# Patient Record
Sex: Female | Born: 1945 | Race: White | Hispanic: No | Smoking: Current every day smoker
Health system: Southern US, Community
[De-identification: ages and names within clinical notes are randomized; demographics above are authoritative.]

## PROBLEM LIST (undated history)

## (undated) DIAGNOSIS — J449 Chronic obstructive pulmonary disease, unspecified: Secondary | ICD-10-CM

## (undated) DIAGNOSIS — E7800 Pure hypercholesterolemia, unspecified: Secondary | ICD-10-CM

## (undated) DIAGNOSIS — J45909 Unspecified asthma, uncomplicated: Secondary | ICD-10-CM

## (undated) DIAGNOSIS — N200 Calculus of kidney: Secondary | ICD-10-CM

## (undated) DIAGNOSIS — C801 Malignant (primary) neoplasm, unspecified: Secondary | ICD-10-CM

## (undated) DIAGNOSIS — I10 Essential (primary) hypertension: Secondary | ICD-10-CM

## (undated) DIAGNOSIS — R0602 Shortness of breath: Secondary | ICD-10-CM

## (undated) HISTORY — PX: EXPLORATORY LAPAROTOMY: SUR591

## (undated) HISTORY — PX: ABDOMINAL HYSTERECTOMY: SHX81

## (undated) HISTORY — PX: CHOLECYSTECTOMY: SHX55

---

## 1978-09-05 HISTORY — PX: OTHER SURGICAL HISTORY: SHX169

## 2001-03-15 ENCOUNTER — Emergency Department (HOSPITAL_COMMUNITY): Admission: EM | Admit: 2001-03-15 | Discharge: 2001-03-15 | Payer: Self-pay | Admitting: *Deleted

## 2001-03-15 ENCOUNTER — Encounter: Payer: Self-pay | Admitting: *Deleted

## 2003-06-23 ENCOUNTER — Encounter: Payer: Self-pay | Admitting: Family Medicine

## 2003-06-23 ENCOUNTER — Ambulatory Visit (HOSPITAL_COMMUNITY): Admission: RE | Admit: 2003-06-23 | Discharge: 2003-06-23 | Payer: Self-pay | Admitting: Family Medicine

## 2004-09-01 ENCOUNTER — Ambulatory Visit (HOSPITAL_COMMUNITY): Admission: RE | Admit: 2004-09-01 | Discharge: 2004-09-01 | Payer: Self-pay | Admitting: Family Medicine

## 2006-04-17 ENCOUNTER — Ambulatory Visit (HOSPITAL_COMMUNITY): Admission: RE | Admit: 2006-04-17 | Discharge: 2006-04-17 | Payer: Self-pay | Admitting: Family Medicine

## 2007-03-28 ENCOUNTER — Ambulatory Visit (HOSPITAL_COMMUNITY): Admission: RE | Admit: 2007-03-28 | Discharge: 2007-03-28 | Payer: Self-pay | Admitting: Family Medicine

## 2007-04-25 ENCOUNTER — Ambulatory Visit: Payer: Self-pay | Admitting: Vascular Surgery

## 2007-05-31 ENCOUNTER — Ambulatory Visit: Payer: Self-pay | Admitting: Vascular Surgery

## 2008-08-18 ENCOUNTER — Emergency Department (HOSPITAL_COMMUNITY): Admission: EM | Admit: 2008-08-18 | Discharge: 2008-08-18 | Payer: Self-pay | Admitting: Emergency Medicine

## 2010-04-13 ENCOUNTER — Ambulatory Visit (HOSPITAL_COMMUNITY)
Admission: RE | Admit: 2010-04-13 | Discharge: 2010-04-13 | Payer: Self-pay | Source: Home / Self Care | Admitting: Family Medicine

## 2010-09-30 ENCOUNTER — Ambulatory Visit (HOSPITAL_COMMUNITY)
Admission: RE | Admit: 2010-09-30 | Discharge: 2010-09-30 | Payer: Self-pay | Source: Home / Self Care | Attending: Family Medicine | Admitting: Family Medicine

## 2010-10-04 ENCOUNTER — Ambulatory Visit (HOSPITAL_COMMUNITY)
Admission: RE | Admit: 2010-10-04 | Discharge: 2010-10-04 | Payer: Self-pay | Source: Home / Self Care | Attending: Family Medicine | Admitting: Family Medicine

## 2010-10-05 ENCOUNTER — Ambulatory Visit (HOSPITAL_COMMUNITY)
Admission: RE | Admit: 2010-10-05 | Discharge: 2010-10-05 | Payer: Self-pay | Source: Home / Self Care | Attending: Family Medicine | Admitting: Family Medicine

## 2011-01-18 NOTE — Consult Note (Signed)
NEW PATIENT CONSULTATION   Sarah [name]  DOB:  1946-05-16                                       04/25/2007  [id number]#:[id number]   Patient presents today for evaluation of venous pathology.  She is a  very pleasant 65 year old white female with pain over her right  vertebral area.  I had treated patient's husband for abdominal aortic  aneurysm years past.  He is subsequently deceased, and we discussed this  as well.  She reports a throbbing pain over the pretibial area of  reticular veins over her right shin.  She has marked telangiectasia over  both legs, extending also on her chest and also on her face,  particularly in her cheeks bilaterally.  She does report that she has  had some relief with over-the-counter men's support stockings in the  past.  She reports that she has a throbbing pain over these areas on the  pretibial on the right and also on her right lateral calf.   Her past history is significant for hypertension, chronic bronchitis.  She does smoke 1 to 1-1/2 packs of cigarettes per day, has several  social alcohol drinks per week.   Review of systems is positive for heart murmur, bronchitis, asthma,  hiatal hernia.   Medications are atenolol, aspirin, B12, and vitamins.   PHYSICAL EXAMINATION:  A well-developed and well-nourished white female  appearing her staged age of 89.  Blood pressure is 141/83, pulse 68,  respirations 18.  Her dorsalis pedis pulses are 2+ bilaterally.  She  does not have any marked saphenous vein varicosity or any evidence of  tributary varicosities.  She does have reticular veins over the  pretibial area with tenderness also over the lateral right calf.   By hand-held duplex, she does not have any evidence of saphenous vein  reflux.  I discussed this with patient.  I explained that we would  recommend sclerotherapy to these reticular veins for relief of her  symptoms.  She understands this would not be covered by  insurance.  I  explained the cost for this treatment to her, and she is comfortable  with this.  I also explained that we would attempt treatment of her face  and chest following this for cosmetic concerns.  We will schedule this  at her convenience electively.   Larina Earthly, M.D.  Electronically Signed   TFE/MEDQ  D:  04/25/2007  T:  04/27/2007  Job:  [id number]   cc:   Mila Homer. Sudie Bailey, M.D.

## 2011-06-09 LAB — DIFFERENTIAL
Basophils Absolute: 0 10*3/uL (ref 0.0–0.1)
Basophils Relative: 0 % (ref 0–1)
Eosinophils Absolute: 0 10*3/uL (ref 0.0–0.7)
Eosinophils Relative: 0 % (ref 0–5)
Lymphocytes Relative: 4 % — ABNORMAL LOW (ref 12–46)
Lymphs Abs: 0.6 10*3/uL — ABNORMAL LOW (ref 0.7–4.0)
Monocytes Absolute: 0.7 10*3/uL (ref 0.1–1.0)
Monocytes Relative: 5 % (ref 3–12)
Neutro Abs: 12.9 10*3/uL — ABNORMAL HIGH (ref 1.7–7.7)
Neutrophils Relative %: 91 % — ABNORMAL HIGH (ref 43–77)

## 2011-06-09 LAB — CBC
HCT: 38.5 % (ref 36.0–46.0)
Hemoglobin: 13.1 g/dL (ref 12.0–15.0)
MCHC: 34.1 g/dL (ref 30.0–36.0)
MCV: 90.5 fL (ref 78.0–100.0)
Platelets: 207 10*3/uL (ref 150–400)
RBC: 4.25 MIL/uL (ref 3.87–5.11)
RDW: 12.7 % (ref 11.5–15.5)
WBC: 14.2 10*3/uL — ABNORMAL HIGH (ref 4.0–10.5)

## 2011-06-09 LAB — CULTURE, BLOOD (ROUTINE X 2)
Culture: NO GROWTH
Report Status: 12192009

## 2011-06-09 LAB — BASIC METABOLIC PANEL
BUN: 14 mg/dL (ref 6–23)
Chloride: 104 mEq/L (ref 96–112)
Creatinine, Ser: 0.84 mg/dL (ref 0.4–1.2)
GFR calc non Af Amer: >60 mL/min (ref >60)

## 2011-08-09 ENCOUNTER — Ambulatory Visit (HOSPITAL_COMMUNITY)
Admission: RE | Admit: 2011-08-09 | Discharge: 2011-08-09 | Disposition: A | Discharge status: Home / Self Care | Payer: Medicare Other | Source: Ambulatory Visit | Attending: Family Medicine | Admitting: Family Medicine

## 2011-08-09 ENCOUNTER — Other Ambulatory Visit (HOSPITAL_COMMUNITY): Payer: Self-pay | Admitting: Family Medicine

## 2011-08-09 DIAGNOSIS — F17200 Nicotine dependence, unspecified, uncomplicated: Secondary | ICD-10-CM | POA: Insufficient documentation

## 2011-08-09 DIAGNOSIS — R079 Chest pain, unspecified: Secondary | ICD-10-CM | POA: Insufficient documentation

## 2011-08-12 ENCOUNTER — Other Ambulatory Visit (HOSPITAL_COMMUNITY): Payer: Self-pay | Admitting: Orthopaedic Surgery

## 2011-08-12 DIAGNOSIS — N289 Disorder of kidney and ureter, unspecified: Secondary | ICD-10-CM

## 2011-08-17 ENCOUNTER — Ambulatory Visit (HOSPITAL_COMMUNITY)
Admission: RE | Admit: 2011-08-17 | Discharge: 2011-08-17 | Disposition: A | Discharge status: Home / Self Care | Payer: Medicare Other | Source: Ambulatory Visit | Attending: Orthopaedic Surgery | Admitting: Orthopaedic Surgery

## 2011-08-17 ENCOUNTER — Other Ambulatory Visit (HOSPITAL_COMMUNITY): Payer: Self-pay | Admitting: Family Medicine

## 2011-08-17 DIAGNOSIS — N289 Disorder of kidney and ureter, unspecified: Secondary | ICD-10-CM

## 2011-08-17 MED ORDER — GADOBENATE DIMEGLUMINE 529 MG/ML IV SOLN
13.0000 mL | Freq: Once | INTRAVENOUS | Status: AC | PRN
  Administered 2011-08-17: 13 mL via INTRAVENOUS

## 2011-08-18 ENCOUNTER — Telehealth: Payer: Self-pay

## 2011-08-18 NOTE — Telephone Encounter (Signed)
LMOM to call.

## 2011-08-24 NOTE — Telephone Encounter (Signed)
Pt said she is having a lot of trouble with her kidneys at this time.To see a doctor at [facility]. Having a lot of abdominal pain. I offered OV appt for abdominal pain now. She wants to wait til she follows up with kidney doctor at [facility]. She took phone number and will call when ready to schedule.

## 2011-10-21 ENCOUNTER — Other Ambulatory Visit: Payer: Self-pay | Admitting: Urology

## 2011-10-21 ENCOUNTER — Ambulatory Visit (HOSPITAL_COMMUNITY)
Admission: RE | Admit: 2011-10-21 | Discharge: 2011-10-21 | Disposition: A | Discharge status: Home / Self Care | Payer: Medicare Other | Source: Ambulatory Visit | Attending: Urology | Admitting: Urology

## 2011-10-21 ENCOUNTER — Ambulatory Visit (INDEPENDENT_AMBULATORY_CARE_PROVIDER_SITE_OTHER): Payer: Medicare Other | Admitting: Urology

## 2011-10-21 DIAGNOSIS — M545 Low back pain: Secondary | ICD-10-CM

## 2011-10-21 DIAGNOSIS — N200 Calculus of kidney: Secondary | ICD-10-CM | POA: Insufficient documentation

## 2011-10-21 DIAGNOSIS — R1032 Left lower quadrant pain: Secondary | ICD-10-CM | POA: Insufficient documentation

## 2011-10-21 DIAGNOSIS — R1031 Right lower quadrant pain: Secondary | ICD-10-CM | POA: Insufficient documentation

## 2011-10-21 DIAGNOSIS — N281 Cyst of kidney, acquired: Secondary | ICD-10-CM

## 2011-11-09 ENCOUNTER — Other Ambulatory Visit (HOSPITAL_COMMUNITY): Payer: Self-pay | Admitting: Family Medicine

## 2011-11-09 DIAGNOSIS — M79671 Pain in right foot: Secondary | ICD-10-CM

## 2011-11-09 DIAGNOSIS — M79604 Pain in right leg: Secondary | ICD-10-CM

## 2011-11-09 DIAGNOSIS — M7989 Other specified soft tissue disorders: Secondary | ICD-10-CM

## 2011-11-10 ENCOUNTER — Other Ambulatory Visit (HOSPITAL_COMMUNITY): Payer: Self-pay | Admitting: Family Medicine

## 2011-11-10 ENCOUNTER — Ambulatory Visit (HOSPITAL_COMMUNITY)
Admission: RE | Admit: 2011-11-10 | Discharge: 2011-11-10 | Disposition: A | Discharge status: Home / Self Care | Payer: Medicare Other | Source: Ambulatory Visit | Attending: Family Medicine | Admitting: Family Medicine

## 2011-11-10 DIAGNOSIS — M7989 Other specified soft tissue disorders: Secondary | ICD-10-CM

## 2011-11-10 DIAGNOSIS — M79604 Pain in right leg: Secondary | ICD-10-CM

## 2011-11-10 DIAGNOSIS — M79609 Pain in unspecified limb: Secondary | ICD-10-CM | POA: Insufficient documentation

## 2011-11-10 DIAGNOSIS — M79671 Pain in right foot: Secondary | ICD-10-CM

## 2011-11-18 ENCOUNTER — Ambulatory Visit (INDEPENDENT_AMBULATORY_CARE_PROVIDER_SITE_OTHER): Payer: Medicare Other | Admitting: Urology

## 2011-11-18 DIAGNOSIS — N200 Calculus of kidney: Secondary | ICD-10-CM

## 2011-11-18 DIAGNOSIS — M545 Low back pain: Secondary | ICD-10-CM

## 2012-04-03 ENCOUNTER — Other Ambulatory Visit (HOSPITAL_COMMUNITY): Payer: Self-pay | Admitting: Family Medicine

## 2012-04-03 DIAGNOSIS — Z139 Encounter for screening, unspecified: Secondary | ICD-10-CM

## 2012-04-05 ENCOUNTER — Ambulatory Visit (HOSPITAL_COMMUNITY)
Admission: RE | Admit: 2012-04-05 | Discharge: 2012-04-05 | Disposition: A | Discharge status: Home / Self Care | Payer: Medicare Other | Source: Ambulatory Visit | Attending: Family Medicine | Admitting: Family Medicine

## 2012-04-05 DIAGNOSIS — Z1231 Encounter for screening mammogram for malignant neoplasm of breast: Secondary | ICD-10-CM | POA: Insufficient documentation

## 2012-04-05 DIAGNOSIS — Z139 Encounter for screening, unspecified: Secondary | ICD-10-CM

## 2012-04-17 ENCOUNTER — Other Ambulatory Visit: Payer: Self-pay

## 2012-04-17 ENCOUNTER — Telehealth: Payer: Self-pay

## 2012-04-17 DIAGNOSIS — Z139 Encounter for screening, unspecified: Secondary | ICD-10-CM

## 2012-04-24 NOTE — Telephone Encounter (Signed)
MOVI PREP SPLIT DOSING, REGULAR BREAKFAST. CLEAR LIQUIDS AFTER 9 AM.  
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
## 2012-04-25 MED ORDER — PEG-KCL-NACL-NASULF-NA ASC-C 100 G PO SOLR: 1.0000 | ORAL | Status: DC

## 2012-04-25 NOTE — Telephone Encounter (Signed)
Rx sent to [facility] in [place]. Instructions mailed to pt.  

## 2012-05-16 ENCOUNTER — Encounter (HOSPITAL_COMMUNITY): Payer: Self-pay | Admitting: Pharmacy Technician

## 2012-05-22 ENCOUNTER — Telehealth: Payer: Self-pay

## 2012-05-22 NOTE — Telephone Encounter (Signed)
REVIEWED.  

## 2012-05-22 NOTE — Telephone Encounter (Signed)
Called pt to update triage. She is scheduled for her colonoscopy on 05/28/2012. She has not had any new problems and no new medications.

## 2012-05-28 ENCOUNTER — Encounter (HOSPITAL_COMMUNITY)
Admission: RE | Disposition: A | Discharge status: Home / Self Care | Payer: Self-pay | Source: Ambulatory Visit | Attending: Gastroenterology

## 2012-05-28 ENCOUNTER — Ambulatory Visit (HOSPITAL_COMMUNITY)
Admission: RE | Admit: 2012-05-28 | Discharge: 2012-05-28 | Disposition: A | Discharge status: Home / Self Care | Payer: Medicare Other | Source: Ambulatory Visit | Attending: Gastroenterology | Admitting: Gastroenterology

## 2012-05-28 ENCOUNTER — Encounter (HOSPITAL_COMMUNITY): Payer: Self-pay | Admitting: *Deleted

## 2012-05-28 DIAGNOSIS — K5730 Diverticulosis of large intestine without perforation or abscess without bleeding: Secondary | ICD-10-CM

## 2012-05-28 DIAGNOSIS — J449 Chronic obstructive pulmonary disease, unspecified: Secondary | ICD-10-CM | POA: Insufficient documentation

## 2012-05-28 DIAGNOSIS — D128 Benign neoplasm of rectum: Secondary | ICD-10-CM | POA: Insufficient documentation

## 2012-05-28 DIAGNOSIS — D126 Benign neoplasm of colon, unspecified: Secondary | ICD-10-CM

## 2012-05-28 DIAGNOSIS — Z139 Encounter for screening, unspecified: Secondary | ICD-10-CM

## 2012-05-28 DIAGNOSIS — K621 Rectal polyp: Secondary | ICD-10-CM

## 2012-05-28 DIAGNOSIS — Z1211 Encounter for screening for malignant neoplasm of colon: Secondary | ICD-10-CM | POA: Insufficient documentation

## 2012-05-28 DIAGNOSIS — K648 Other hemorrhoids: Secondary | ICD-10-CM | POA: Insufficient documentation

## 2012-05-28 DIAGNOSIS — J4489 Other specified chronic obstructive pulmonary disease: Secondary | ICD-10-CM | POA: Insufficient documentation

## 2012-05-28 DIAGNOSIS — K620 Anal polyp: Secondary | ICD-10-CM

## 2012-05-28 DIAGNOSIS — E7800 Pure hypercholesterolemia, unspecified: Secondary | ICD-10-CM | POA: Insufficient documentation

## 2012-05-28 DIAGNOSIS — I10 Essential (primary) hypertension: Secondary | ICD-10-CM | POA: Insufficient documentation

## 2012-05-28 HISTORY — PX: COLONOSCOPY: SHX5424

## 2012-05-28 HISTORY — DX: Shortness of breath: R06.02

## 2012-05-28 HISTORY — DX: Unspecified asthma, uncomplicated: J45.909

## 2012-05-28 HISTORY — DX: Chronic obstructive pulmonary disease, unspecified: J44.9

## 2012-05-28 HISTORY — DX: Pure hypercholesterolemia, unspecified: E78.00

## 2012-05-28 HISTORY — DX: Essential (primary) hypertension: I10

## 2012-05-28 HISTORY — DX: Calculus of kidney: N20.0

## 2012-05-28 SURGERY — COLONOSCOPY
Anesthesia: Moderate Sedation

## 2012-05-28 MED ORDER — SODIUM CHLORIDE 0.45 % IV SOLN
INTRAVENOUS | Status: DC
  Administered 2012-05-28: 09:00:00 via INTRAVENOUS

## 2012-05-28 MED ORDER — MEPERIDINE HCL 100 MG/ML IJ SOLN
INTRAMUSCULAR | Status: DC | PRN
  Administered 2012-05-28 (×2): 25 mg via INTRAVENOUS

## 2012-05-28 MED ORDER — MIDAZOLAM HCL 5 MG/5ML IJ SOLN
INTRAMUSCULAR | Status: AC
  Filled 2012-05-28: qty 10

## 2012-05-28 MED ORDER — MEPERIDINE HCL 100 MG/ML IJ SOLN
INTRAMUSCULAR | Status: AC
  Filled 2012-05-28: qty 1

## 2012-05-28 MED ORDER — MIDAZOLAM HCL 5 MG/5ML IJ SOLN
INTRAMUSCULAR | Status: DC | PRN
  Administered 2012-05-28: 1 mg via INTRAVENOUS
  Administered 2012-05-28 (×2): 2 mg via INTRAVENOUS

## 2012-05-28 NOTE — H&P (Signed)
Primary Care Physician:  Milana Obey, MD Primary Gastroenterologist:  Dr. Darrick Penna  Pre-Procedure History & Physical: HPI:  Sarah Rich is a 66 y.o. female here for COLON CANCER SCREENING.   Past Medical History  Diagnosis Date  . Hypertension   . Hypercholesteremia   . Asthma   . COPD (chronic obstructive pulmonary disease)   . Shortness of breath   . Kidney stones     Past Surgical History  Procedure Date  . Abdominal hysterectomy   . Cholecystectomy   . Exploratory laparotomy   . Right breast cystectomy 1980    benign    Prior to Admission medications   Medication Sig Start Date End Date Taking? Authorizing Provider  acetaminophen (TYLENOL) 500 MG tablet Take 500 mg by mouth every 6 (six) hours as needed. Only as needed   Yes Historical Provider, MD  albuterol (PROVENTIL HFA;VENTOLIN HFA) 108 (90 BASE) MCG/ACT inhaler Inhale 2 puffs into the lungs every 6 (six) hours as needed. Shortness of Breath   Yes Historical Provider, MD  ALPRAZolam (XANAX) 0.5 MG tablet Take 0.025 mg by mouth at bedtime as needed. Pt takes 1/2 tablet at bed time   Yes Historical Provider, MD  aspirin 81 MG tablet Take 81 mg by mouth daily.   Yes Historical Provider, MD  atenolol (TENORMIN) 50 MG tablet Take 50 mg by mouth daily.   Yes Historical Provider, MD  cetirizine (ZYRTEC) 10 MG tablet Take 10 mg by mouth daily.   Yes Historical Provider, MD  cholecalciferol (VITAMIN D) 1000 UNITS tablet Take 1,000 Units by mouth daily.   Yes Historical Provider, MD  hydroxypropyl methylcellulose (ISOPTO TEARS) 2.5 % ophthalmic solution Place 1 drop into both eyes 3 (three) times daily as needed. Dry Eyes   Yes Historical Provider, MD  Multiple Vitamin (MULTIVITAMIN) tablet Take 1 tablet by mouth daily.   Yes Historical Provider, MD  pravastatin (PRAVACHOL) 40 MG tablet Take 40 mg by mouth daily.   Yes Historical Provider, MD  vitamin B-12 (CYANOCOBALAMIN) 1000 MCG tablet Take 1,000 mcg by mouth daily.    Yes Historical Provider, MD    Allergies as of 04/17/2012  . (Not on File)    Family History  Problem Relation Age of Onset  . Colon cancer Neg Hx     History   Social History  . Marital Status: Widowed    Spouse Name: N/A    Number of Children: N/A  . Years of Education: N/A   Occupational History  . Not on file.   Social History Main Topics  . Smoking status: Current Every Day Smoker -- 0.2 packs/day for 50 years    Types: Cigarettes  . Smokeless tobacco: Not on file  . Alcohol Use: 1.5 oz/week    3 drink(s) per week  . Drug Use: No  . Sexually Active:    Other Topics Concern  . Not on file   Social History Narrative  . No narrative on file    Review of Systems: See HPI, otherwise negative ROS   Physical Exam: BP 159/76  Pulse 59  Temp 97.5 F (36.4 C) (Oral)  Resp 23  Ht 5\' 8"  (1.727 m)  Wt 138 lb (62.596 kg)  BMI 20.98 kg/m2  SpO2 93% General:   Alert,  pleasant and cooperative in NAD Head:  Normocephalic and atraumatic. Neck:  Supple; Lungs:  Clear throughout to auscultation.    Heart:  Regular rate and rhythm. Abdomen:  Soft, nontender and nondistended. Normal bowel  sounds, without guarding, and without rebound.   Neurologic:  Alert and  oriented x4;  grossly normal neurologically.  Impression/Plan:     SCREENING  Plan:  1. TCS TODAY

## 2012-05-28 NOTE — Plan of Care (Signed)
You had 2 small polyps removed. You have small internal hemorrhoids.   FOLLOW A HIGH FIBER DIET. AVOID ITEMS THAT CAUSE BLOATING. SEE INFO BELOW.  YOUR BIOPSY RESULTS SHOULD BE BACK IN 7 DAYS.  Next colonoscopy in 10 years.   Colonoscopy Care After Read the instructions outlined below and refer to this sheet in the next week. These discharge instructions provide you with general information on caring for yourself after you leave the hospital. While your treatment has been planned according to the most current medical practices available, unavoidable complications occasionally occur. If you have any problems or questions after discharge, call DR. Jolonda Gomm, [phone].  ACTIVITY  You may resume your regular activity, but move at a slower pace for the next 24 hours.   Take frequent rest periods for the next 24 hours.   Walking will help get rid of the air and reduce the bloated feeling in your belly (abdomen).   No driving for 24 hours (because of the medicine (anesthesia) used during the test).   You may shower.   Do not sign any important legal documents or operate any machinery for 24 hours (because of the anesthesia used during the test).    NUTRITION  Drink plenty of fluids.   You may resume your normal diet as instructed by your doctor.   Begin with a light meal and progress to your normal diet. Heavy or fried foods are harder to digest and may make you feel sick to your stomach (nauseated).   Avoid alcoholic beverages for 24 hours or as instructed.    MEDICATIONS  You may resume your normal medications.   WHAT YOU CAN EXPECT TODAY  Some feelings of bloating in the abdomen.   Passage of more gas than usual.   Spotting of blood in your stool or on the toilet paper  .  IF YOU HAD POLYPS REMOVED DURING THE COLONOSCOPY:  Eat a soft diet IF YOU HAVE NAUSEA, BLOATING, ABDOMINAL PAIN, OR VOMITING.    FINDING OUT THE RESULTS OF YOUR TEST Not all test results are  available during your visit. DR. Darrick Penna WILL CALL YOU WITHIN 7 DAYS OF YOUR PROCEDUE WITH YOUR RESULTS. Do not assume everything is normal if you have not heard from DR. Lenward Able IN ONE WEEK, CALL HER OFFICE AT [phone].  SEEK IMMEDIATE MEDICAL ATTENTION AND CALL THE OFFICE: [phone] IF:  You have more than a spotting of blood in your stool.   Your belly is swollen (abdominal distention).   You are nauseated or vomiting.   You have a temperature over 101F.   You have abdominal pain or discomfort that is severe or gets worse throughout the day.  Polyps, Colon  A polyp is extra tissue that grows inside your body. Colon polyps grow in the large intestine. The large intestine, also called the colon, is part of your digestive system. It is a long, hollow tube at the end of your digestive tract where your body makes and stores stool. Most polyps are not dangerous. They are benign. This means they are not cancerous. But over time, some types of polyps can turn into cancer. Polyps that are smaller than a pea are usually not harmful. But larger polyps could someday become or may already be cancerous. To be safe, doctors remove all polyps and test them.   WHO GETS POLYPS? Anyone can get polyps, but certain people are more likely than others. You may have a greater chance of getting polyps if:  You are  over 50.   You have had polyps before.   Someone in your family has had polyps.   Someone in your family has had cancer of the large intestine.   Find out if someone in your family has had polyps. You may also be more likely to get polyps if you:   Eat a lot of fatty foods   Smoke   Drink alcohol   Do not exercise  Eat too much   TREATMENT  The caregiver will remove the polyp during sigmoidoscopy or colonoscopy.  PREVENTION There is not one sure way to prevent polyps. You might be able to lower your risk of getting them if you:  Eat more fruits and vegetables and less fatty food.     Do not smoke.   Avoid alcohol.   Exercise every day.   Lose weight if you are overweight.   Eating more calcium and folate can also lower your risk of getting polyps. Some foods that are rich in calcium are milk, cheese, and broccoli. Some foods that are rich in folate are chickpeas, kidney beans, and spinach.   High-Fiber Diet A high-fiber diet changes your normal diet to include more whole grains, legumes, fruits, and vegetables. Changes in the diet involve replacing refined carbohydrates with unrefined foods. The calorie level of the diet is essentially unchanged. The Dietary Reference Intake (recommended amount) for adult males is 38 grams per day. For adult females, it is 25 grams per day. Pregnant and lactating women should consume 28 grams of fiber per day. Fiber is the intact part of a plant that is not broken down during digestion. Functional fiber is fiber that has been isolated from the plant to provide a beneficial effect in the body. PURPOSE  Increase stool bulk.   Ease and regulate bowel movements.   Lower cholesterol.  INDICATIONS THAT YOU NEED MORE FIBER  Constipation and hemorrhoids.   Uncomplicated diverticulosis (intestine condition) and irritable bowel syndrome.   Weight management.   As a protective measure against hardening of the arteries (atherosclerosis), diabetes, and cancer.   GUIDELINES FOR INCREASING FIBER IN THE DIET  Start adding fiber to the diet slowly. A gradual increase of about 5 more grams (2 slices of whole-wheat bread, 2 servings of most fruits or vegetables, or 1 bowl of high-fiber cereal) per day is best. Too rapid an increase in fiber may result in constipation, flatulence, and bloating.   Drink enough water and fluids to keep your urine clear or pale yellow. Water, juice, or caffeine-free drinks are recommended. Not drinking enough fluid may cause constipation.   Eat a variety of high-fiber foods rather than one type of fiber.   Try  to increase your intake of fiber through using high-fiber foods rather than fiber pills or supplements that contain small amounts of fiber.   The goal is to change the types of food eaten. Do not supplement your present diet with high-fiber foods, but replace foods in your present diet.  INCLUDE A VARIETY OF FIBER SOURCES  Replace refined and processed grains with whole grains, canned fruits with fresh fruits, and incorporate other fiber sources. White rice, white breads, and most bakery goods contain little or no fiber.   Brown whole-grain rice, buckwheat oats, and many fruits and vegetables are all good sources of fiber. These include: broccoli, Brussels sprouts, cabbage, cauliflower, beets, sweet potatoes, white potatoes (skin on), carrots, tomatoes, eggplant, squash, berries, fresh fruits, and dried fruits.   Cereals appear to be the  richest source of fiber. Cereal fiber is found in whole grains and bran. Bran is the fiber-rich outer coat of cereal grain, which is largely removed in refining. In whole-grain cereals, the bran remains. In breakfast cereals, the largest amount of fiber is found in those with "bran" in their names. The fiber content is sometimes indicated on the label.   You may need to include additional fruits and vegetables each day.   In baking, for 1 cup white flour, you may use the following substitutions:   1 cup whole-wheat flour minus 2 tablespoons.   1/2 cup white flour plus 1/2 cup whole-wheat flour.   Hemorrhoids Hemorrhoids are dilated (enlarged) veins around the rectum. Sometimes clots will form in the veins. This makes them swollen and painful. These are called thrombosed hemorrhoids. Causes of hemorrhoids include:  Constipation.   Straining to have a bowel movement.   HEAVY LIFTING HOME CARE INSTRUCTIONS  Eat a well balanced diet and drink 6 to 8 glasses of water every day to avoid constipation. You may also use a bulk laxative.   Avoid straining to  have bowel movements.   Keep anal area dry and clean.   Do not use a donut shaped pillow or sit on the toilet for long periods. This increases blood pooling and pain.   Move your bowels when your body has the urge; this will require less straining and will decrease pain and pressure.

## 2012-05-28 NOTE — Op Note (Signed)
[facility] [address] [place], [address]   COLONOSCOPY PROCEDURE REPORT  PATIENT: Sarah, Rich  MR#: [id number] BIRTHDATE: 1945-12-14 , 66  yrs. old GENDER: Female ENDOSCOPIST: Jonette Eva, MD REFERRED XB:JYNWG Sudie Bailey, M.D. PROCEDURE DATE:  05/28/2012 PROCEDURE:   Colonoscopy with biopsy INDICATIONS:average risk patient for colon cancer.-NEVER HAD PRIOR TCS MEDICATIONS: Demerol 50 mg IV and Versed 5 mg IV  DESCRIPTION OF PROCEDURE:    Physical exam was performed.  Informed consent was obtained from the patient after explaining the benefits, risks, and alternatives to procedure.  The patient was connected to monitor and placed in left lateral position. Continuous oxygen was provided by nasal cannula and IV medicine administered through an indwelling cannula.  After administration of sedation and rectal exam, the patients rectum was intubated and the EC-[id number] ([id number])  colonoscope was advanced under direct visualization to the cecum.  The scope was removed slowly by carefully examining the color, texture, anatomy, and integrity mucosa on the way out.  The patient was recovered in endoscopy and discharged home in satisfactory condition.       COLON FINDINGS: Two sessile polyps ranging between 3-72mm in size were found in the ascending colon and rectum.  A polypectomy was performed with cold forceps.  , The colon was otherwise normal. There was no diverticulosis, inflammation, or cancers unless previously stated.  , Mild diverticulosis was noted in the sigmoid colon.  , and Moderate sized internal hemorrhoids were found.  PREP QUALITY: excellent. CECAL W/D TIME: 13 minutes  COMPLICATIONS: None  ENDOSCOPIC IMPRESSION: 1.   Two sessile polyps ranging between 3-67mm in size were found in the ascending colon and rectum; polypectomy was performed with cold forceps 2.   The colon was otherwise normal 3.   Mild diverticulosis was noted in the  sigmoid colon 4.   Moderate sized internal hemorrhoids   RECOMMENDATIONS: AWAIT BIOPSY HIGH FIBER DIET TCS IN 10 YEAR       _______________________________ eSignedJonette Eva, MD 05/28/2012 10:26 AM     PATIENT NAME:  Sarah, Rich MR#: [id number]

## 2012-05-31 ENCOUNTER — Encounter (HOSPITAL_COMMUNITY): Payer: Self-pay | Admitting: Gastroenterology

## 2012-06-01 ENCOUNTER — Telehealth: Payer: Self-pay | Admitting: Gastroenterology

## 2012-06-01 NOTE — Telephone Encounter (Signed)
Please call pt. She had A simple adenoma removed from her colon.   FOLLOW A HIGH FIBER DIET. AVOID ITEMS THAT CAUSE BLOATING.   Next colonoscopy in 10 years.

## 2012-06-04 NOTE — Telephone Encounter (Signed)
Called and informed pt.  

## 2012-06-04 NOTE — Telephone Encounter (Signed)
Recall made, path faxed to PCP

## 2012-09-18 ENCOUNTER — Other Ambulatory Visit: Payer: Self-pay | Admitting: Urology

## 2012-09-18 DIAGNOSIS — N281 Cyst of kidney, acquired: Secondary | ICD-10-CM

## 2012-11-22 ENCOUNTER — Ambulatory Visit (HOSPITAL_COMMUNITY)
Admission: RE | Admit: 2012-11-22 | Discharge: 2012-11-22 | Disposition: A | Discharge status: Home / Self Care | Payer: Medicare Other | Source: Ambulatory Visit | Attending: Urology | Admitting: Urology

## 2012-11-22 ENCOUNTER — Other Ambulatory Visit: Payer: Self-pay | Admitting: Urology

## 2012-11-22 DIAGNOSIS — Q619 Cystic kidney disease, unspecified: Secondary | ICD-10-CM | POA: Insufficient documentation

## 2012-11-22 DIAGNOSIS — N200 Calculus of kidney: Secondary | ICD-10-CM | POA: Insufficient documentation

## 2012-11-22 DIAGNOSIS — N281 Cyst of kidney, acquired: Secondary | ICD-10-CM

## 2012-11-30 ENCOUNTER — Ambulatory Visit (INDEPENDENT_AMBULATORY_CARE_PROVIDER_SITE_OTHER): Payer: Medicare Other | Admitting: Urology

## 2012-11-30 DIAGNOSIS — N281 Cyst of kidney, acquired: Secondary | ICD-10-CM

## 2012-11-30 DIAGNOSIS — N200 Calculus of kidney: Secondary | ICD-10-CM

## 2012-11-30 DIAGNOSIS — M545 Low back pain: Secondary | ICD-10-CM

## 2013-01-08 ENCOUNTER — Other Ambulatory Visit (HOSPITAL_COMMUNITY): Payer: Self-pay | Admitting: Family Medicine

## 2013-01-08 ENCOUNTER — Ambulatory Visit (HOSPITAL_COMMUNITY)
Admission: RE | Admit: 2013-01-08 | Discharge: 2013-01-08 | Disposition: A | Discharge status: Home / Self Care | Payer: Medicare Other | Source: Ambulatory Visit | Attending: Family Medicine | Admitting: Family Medicine

## 2013-01-08 DIAGNOSIS — R05 Cough: Secondary | ICD-10-CM

## 2013-01-08 DIAGNOSIS — R059 Cough, unspecified: Secondary | ICD-10-CM | POA: Insufficient documentation

## 2013-01-08 DIAGNOSIS — J449 Chronic obstructive pulmonary disease, unspecified: Secondary | ICD-10-CM | POA: Insufficient documentation

## 2013-01-08 DIAGNOSIS — J4489 Other specified chronic obstructive pulmonary disease: Secondary | ICD-10-CM | POA: Insufficient documentation

## 2013-01-11 ENCOUNTER — Other Ambulatory Visit (HOSPITAL_COMMUNITY): Payer: Self-pay | Admitting: Family Medicine

## 2013-01-11 DIAGNOSIS — J449 Chronic obstructive pulmonary disease, unspecified: Secondary | ICD-10-CM

## 2013-01-14 ENCOUNTER — Encounter (HOSPITAL_COMMUNITY): Payer: Self-pay

## 2013-01-14 ENCOUNTER — Ambulatory Visit (HOSPITAL_COMMUNITY)
Admission: RE | Admit: 2013-01-14 | Discharge: 2013-01-14 | Disposition: A | Discharge status: Home / Self Care | Payer: Medicare Other | Source: Ambulatory Visit | Attending: Family Medicine | Admitting: Family Medicine

## 2013-01-14 DIAGNOSIS — J4489 Other specified chronic obstructive pulmonary disease: Secondary | ICD-10-CM | POA: Insufficient documentation

## 2013-01-14 DIAGNOSIS — I10 Essential (primary) hypertension: Secondary | ICD-10-CM | POA: Insufficient documentation

## 2013-01-14 DIAGNOSIS — J449 Chronic obstructive pulmonary disease, unspecified: Secondary | ICD-10-CM | POA: Insufficient documentation

## 2013-01-14 DIAGNOSIS — R911 Solitary pulmonary nodule: Secondary | ICD-10-CM | POA: Insufficient documentation

## 2013-01-14 MED ORDER — IOHEXOL 300 MG/ML  SOLN
80.0000 mL | Freq: Once | INTRAMUSCULAR | Status: AC | PRN
  Administered 2013-01-14: 80 mL via INTRAVENOUS

## 2013-03-13 ENCOUNTER — Other Ambulatory Visit: Payer: Self-pay | Admitting: Neurology

## 2013-03-15 ENCOUNTER — Other Ambulatory Visit (HOSPITAL_COMMUNITY): Payer: Medicare Other

## 2013-03-18 ENCOUNTER — Ambulatory Visit (HOSPITAL_COMMUNITY)
Admission: RE | Admit: 2013-03-18 | Discharge: 2013-03-18 | Disposition: A | Discharge status: Home / Self Care | Payer: Medicare Other | Source: Ambulatory Visit | Attending: Neurology | Admitting: Neurology

## 2013-03-18 DIAGNOSIS — R51 Headache: Secondary | ICD-10-CM | POA: Insufficient documentation

## 2013-03-18 DIAGNOSIS — R9389 Abnormal findings on diagnostic imaging of other specified body structures: Secondary | ICD-10-CM | POA: Insufficient documentation

## 2013-07-08 ENCOUNTER — Other Ambulatory Visit (HOSPITAL_COMMUNITY): Payer: Self-pay | Admitting: Family Medicine

## 2013-07-08 DIAGNOSIS — R222 Localized swelling, mass and lump, trunk: Secondary | ICD-10-CM

## 2013-07-10 ENCOUNTER — Ambulatory Visit (HOSPITAL_COMMUNITY)
Admission: RE | Admit: 2013-07-10 | Discharge: 2013-07-10 | Disposition: A | Discharge status: Home / Self Care | Payer: Medicare Other | Source: Ambulatory Visit | Attending: Family Medicine | Admitting: Family Medicine

## 2013-07-10 DIAGNOSIS — Z09 Encounter for follow-up examination after completed treatment for conditions other than malignant neoplasm: Secondary | ICD-10-CM | POA: Insufficient documentation

## 2013-07-10 DIAGNOSIS — R222 Localized swelling, mass and lump, trunk: Secondary | ICD-10-CM

## 2013-07-10 DIAGNOSIS — R911 Solitary pulmonary nodule: Secondary | ICD-10-CM | POA: Insufficient documentation

## 2013-07-10 LAB — POCT I-STAT CREATININE: Creatinine, Ser: 1.2 mg/dL — ABNORMAL HIGH (ref 0.50–1.10)

## 2013-07-10 MED ORDER — IOHEXOL 300 MG/ML  SOLN
80.0000 mL | Freq: Once | INTRAMUSCULAR | Status: AC | PRN
  Administered 2013-07-10: 64 mL via INTRAVENOUS

## 2013-11-15 ENCOUNTER — Other Ambulatory Visit: Payer: Self-pay | Admitting: Urology

## 2013-11-15 DIAGNOSIS — N281 Cyst of kidney, acquired: Secondary | ICD-10-CM

## 2013-12-09 ENCOUNTER — Ambulatory Visit (HOSPITAL_COMMUNITY)
Admission: RE | Admit: 2013-12-09 | Discharge: 2013-12-09 | Disposition: A | Discharge status: Home / Self Care | Payer: Medicare Other | Source: Ambulatory Visit | Attending: Urology | Admitting: Urology

## 2013-12-09 ENCOUNTER — Other Ambulatory Visit: Payer: Self-pay | Admitting: Urology

## 2013-12-09 DIAGNOSIS — N200 Calculus of kidney: Secondary | ICD-10-CM

## 2013-12-09 DIAGNOSIS — Q6101 Congenital single renal cyst: Secondary | ICD-10-CM | POA: Insufficient documentation

## 2013-12-09 DIAGNOSIS — N281 Cyst of kidney, acquired: Secondary | ICD-10-CM

## 2013-12-09 DIAGNOSIS — N289 Disorder of kidney and ureter, unspecified: Secondary | ICD-10-CM | POA: Insufficient documentation

## 2013-12-13 ENCOUNTER — Ambulatory Visit (INDEPENDENT_AMBULATORY_CARE_PROVIDER_SITE_OTHER): Payer: Medicare Other | Admitting: Urology

## 2013-12-13 DIAGNOSIS — M545 Low back pain, unspecified: Secondary | ICD-10-CM

## 2013-12-13 DIAGNOSIS — N281 Cyst of kidney, acquired: Secondary | ICD-10-CM

## 2013-12-13 DIAGNOSIS — N200 Calculus of kidney: Secondary | ICD-10-CM

## 2014-03-18 ENCOUNTER — Other Ambulatory Visit (HOSPITAL_COMMUNITY): Payer: Self-pay | Admitting: Family Medicine

## 2014-03-18 ENCOUNTER — Ambulatory Visit (HOSPITAL_COMMUNITY)
Admission: RE | Admit: 2014-03-18 | Discharge: 2014-03-18 | Disposition: A | Discharge status: Home / Self Care | Payer: Medicare Other | Source: Ambulatory Visit | Attending: Family Medicine | Admitting: Family Medicine

## 2014-03-18 DIAGNOSIS — M5431 Sciatica, right side: Secondary | ICD-10-CM

## 2014-03-18 DIAGNOSIS — M5430 Sciatica, unspecified side: Secondary | ICD-10-CM | POA: Insufficient documentation

## 2015-01-06 ENCOUNTER — Other Ambulatory Visit: Payer: Self-pay | Admitting: Urology

## 2015-01-06 DIAGNOSIS — N200 Calculus of kidney: Secondary | ICD-10-CM

## 2015-01-14 ENCOUNTER — Other Ambulatory Visit (HOSPITAL_COMMUNITY): Payer: Self-pay | Admitting: Family Medicine

## 2015-01-14 ENCOUNTER — Ambulatory Visit (HOSPITAL_COMMUNITY)
Admission: RE | Admit: 2015-01-14 | Discharge: 2015-01-14 | Disposition: A | Discharge status: Home / Self Care | Payer: Medicare Other | Source: Ambulatory Visit | Attending: Family Medicine | Admitting: Family Medicine

## 2015-01-14 DIAGNOSIS — M542 Cervicalgia: Secondary | ICD-10-CM

## 2015-01-14 DIAGNOSIS — F17210 Nicotine dependence, cigarettes, uncomplicated: Secondary | ICD-10-CM | POA: Diagnosis present

## 2015-01-14 DIAGNOSIS — J984 Other disorders of lung: Secondary | ICD-10-CM | POA: Insufficient documentation

## 2015-01-14 DIAGNOSIS — F17200 Nicotine dependence, unspecified, uncomplicated: Secondary | ICD-10-CM

## 2015-01-26 ENCOUNTER — Ambulatory Visit (HOSPITAL_COMMUNITY)
Admission: RE | Admit: 2015-01-26 | Discharge: 2015-01-26 | Disposition: A | Discharge status: Home / Self Care | Payer: Medicare Other | Source: Ambulatory Visit | Attending: Urology | Admitting: Urology

## 2015-01-26 ENCOUNTER — Other Ambulatory Visit: Payer: Self-pay | Admitting: Urology

## 2015-01-26 DIAGNOSIS — N200 Calculus of kidney: Secondary | ICD-10-CM

## 2015-01-30 ENCOUNTER — Ambulatory Visit (INDEPENDENT_AMBULATORY_CARE_PROVIDER_SITE_OTHER): Payer: Medicare Other | Admitting: Urology

## 2015-01-30 DIAGNOSIS — N200 Calculus of kidney: Secondary | ICD-10-CM

## 2015-01-30 DIAGNOSIS — N281 Cyst of kidney, acquired: Secondary | ICD-10-CM | POA: Diagnosis not present

## 2015-10-19 ENCOUNTER — Encounter (HOSPITAL_COMMUNITY): Payer: Self-pay | Admitting: Emergency Medicine

## 2015-10-19 ENCOUNTER — Inpatient Hospital Stay (HOSPITAL_COMMUNITY)
Admission: EM | Admit: 2015-10-19 | Discharge: 2015-10-22 | DRG: 190 | Disposition: A | Discharge status: Home Health | Payer: Medicare Other | Attending: Internal Medicine | Admitting: Internal Medicine

## 2015-10-19 ENCOUNTER — Emergency Department (HOSPITAL_COMMUNITY): Payer: Medicare Other

## 2015-10-19 DIAGNOSIS — Z7982 Long term (current) use of aspirin: Secondary | ICD-10-CM

## 2015-10-19 DIAGNOSIS — F17210 Nicotine dependence, cigarettes, uncomplicated: Secondary | ICD-10-CM | POA: Diagnosis present

## 2015-10-19 DIAGNOSIS — Z87442 Personal history of urinary calculi: Secondary | ICD-10-CM

## 2015-10-19 DIAGNOSIS — I10 Essential (primary) hypertension: Secondary | ICD-10-CM | POA: Diagnosis present

## 2015-10-19 DIAGNOSIS — Z7951 Long term (current) use of inhaled steroids: Secondary | ICD-10-CM

## 2015-10-19 DIAGNOSIS — R0902 Hypoxemia: Secondary | ICD-10-CM

## 2015-10-19 DIAGNOSIS — E7800 Pure hypercholesterolemia, unspecified: Secondary | ICD-10-CM | POA: Diagnosis present

## 2015-10-19 DIAGNOSIS — J441 Chronic obstructive pulmonary disease with (acute) exacerbation: Principal | ICD-10-CM | POA: Diagnosis present

## 2015-10-19 DIAGNOSIS — J9621 Acute and chronic respiratory failure with hypoxia: Secondary | ICD-10-CM | POA: Diagnosis present

## 2015-10-19 DIAGNOSIS — Z79899 Other long term (current) drug therapy: Secondary | ICD-10-CM

## 2015-10-19 DIAGNOSIS — J45909 Unspecified asthma, uncomplicated: Secondary | ICD-10-CM | POA: Diagnosis present

## 2015-10-19 LAB — BASIC METABOLIC PANEL
Anion gap: 9 (ref 5–15)
BUN: 16 mg/dL (ref 6–20)
CHLORIDE: 99 mmol/L — AB (ref 101–111)
CO2: 29 mmol/L (ref 22–32)
Calcium: 9.3 mg/dL (ref 8.9–10.3)
Creatinine, Ser: 0.83 mg/dL (ref 0.44–1.00)
GFR calc Af Amer: >60 mL/min (ref >60)
GFR calc non Af Amer: >60 mL/min (ref >60)
Glucose, Bld: 132 mg/dL — ABNORMAL HIGH (ref 65–99)
Potassium: 3.7 mmol/L (ref 3.5–5.1)
SODIUM: 137 mmol/L (ref 135–145)

## 2015-10-19 LAB — CBC WITH DIFFERENTIAL/PLATELET
BASOS ABS: 0 10*3/uL (ref 0.0–0.1)
Basophils Relative: 0 %
EOS PCT: 0 %
Eosinophils Absolute: 0 10*3/uL (ref 0.0–0.7)
HEMATOCRIT: 43.2 % (ref 36.0–46.0)
Hemoglobin: 14.4 g/dL (ref 12.0–15.0)
LYMPHS PCT: 13 %
Lymphs Abs: 0.6 10*3/uL — ABNORMAL LOW (ref 0.7–4.0)
MCH: 30.6 pg (ref 26.0–34.0)
MCHC: 33.3 g/dL (ref 30.0–36.0)
MCV: 91.7 fL (ref 78.0–100.0)
MONO ABS: 0.6 10*3/uL (ref 0.1–1.0)
Monocytes Relative: 12 %
NEUTROS ABS: 3.3 10*3/uL (ref 1.7–7.7)
Neutrophils Relative %: 75 %
Platelets: 189 10*3/uL (ref 150–400)
RBC: 4.71 MIL/uL (ref 3.87–5.11)
RDW: 12.5 % (ref 11.5–15.5)
WBC: 4.5 10*3/uL (ref 4.0–10.5)

## 2015-10-19 LAB — TROPONIN I

## 2015-10-19 MED ORDER — ALBUTEROL SULFATE (2.5 MG/3ML) 0.083% IN NEBU
5.0000 mg | INHALATION_SOLUTION | Freq: Once | RESPIRATORY_TRACT | Status: AC
  Administered 2015-10-19: 5 mg via RESPIRATORY_TRACT
  Filled 2015-10-19: qty 6

## 2015-10-19 MED ORDER — METHYLPREDNISOLONE SODIUM SUCC 125 MG IJ SOLR
125.0000 mg | Freq: Once | INTRAMUSCULAR | Status: AC
  Administered 2015-10-20: 125 mg via INTRAVENOUS
  Filled 2015-10-19: qty 2

## 2015-10-19 MED ORDER — ALBUTEROL (5 MG/ML) CONTINUOUS INHALATION SOLN
15.0000 mg/h | INHALATION_SOLUTION | RESPIRATORY_TRACT | Status: DC
  Administered 2015-10-19: 15 mg/h via RESPIRATORY_TRACT
  Filled 2015-10-19: qty 20
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
## 2015-10-19 NOTE — ED Notes (Signed)
Pt c/o sob and sudden onset of not being able to get her breath.

## 2015-10-20 ENCOUNTER — Encounter (HOSPITAL_COMMUNITY): Payer: Self-pay | Admitting: *Deleted

## 2015-10-20 DIAGNOSIS — J45909 Unspecified asthma, uncomplicated: Secondary | ICD-10-CM | POA: Diagnosis present

## 2015-10-20 DIAGNOSIS — J9621 Acute and chronic respiratory failure with hypoxia: Secondary | ICD-10-CM | POA: Diagnosis present

## 2015-10-20 DIAGNOSIS — E7800 Pure hypercholesterolemia, unspecified: Secondary | ICD-10-CM | POA: Diagnosis present

## 2015-10-20 DIAGNOSIS — J441 Chronic obstructive pulmonary disease with (acute) exacerbation: Secondary | ICD-10-CM | POA: Diagnosis present

## 2015-10-20 DIAGNOSIS — Z7982 Long term (current) use of aspirin: Secondary | ICD-10-CM | POA: Diagnosis not present

## 2015-10-20 DIAGNOSIS — I10 Essential (primary) hypertension: Secondary | ICD-10-CM | POA: Diagnosis not present

## 2015-10-20 DIAGNOSIS — F17210 Nicotine dependence, cigarettes, uncomplicated: Secondary | ICD-10-CM | POA: Diagnosis present

## 2015-10-20 DIAGNOSIS — Z79899 Other long term (current) drug therapy: Secondary | ICD-10-CM | POA: Diagnosis not present

## 2015-10-20 DIAGNOSIS — Z87442 Personal history of urinary calculi: Secondary | ICD-10-CM | POA: Diagnosis not present

## 2015-10-20 DIAGNOSIS — Z7951 Long term (current) use of inhaled steroids: Secondary | ICD-10-CM | POA: Diagnosis not present

## 2015-10-20 DIAGNOSIS — R0902 Hypoxemia: Secondary | ICD-10-CM | POA: Diagnosis present

## 2015-10-20 LAB — COMPREHENSIVE METABOLIC PANEL
ALK PHOS: 65 U/L (ref 38–126)
ALT: 20 U/L (ref 14–54)
ANION GAP: 13 (ref 5–15)
AST: 35 U/L (ref 15–41)
Albumin: 3.7 g/dL (ref 3.5–5.0)
BILIRUBIN TOTAL: 0.2 mg/dL — AB (ref 0.3–1.2)
BUN: 15 mg/dL (ref 6–20)
CALCIUM: 8.9 mg/dL (ref 8.9–10.3)
CO2: 27 mmol/L (ref 22–32)
Chloride: 98 mmol/L — ABNORMAL LOW (ref 101–111)
Creatinine, Ser: 0.8 mg/dL (ref 0.44–1.00)
GFR calc non Af Amer: >60 mL/min (ref >60)
GLUCOSE: 239 mg/dL — AB (ref 65–99)
Potassium: 3.6 mmol/L (ref 3.5–5.1)
Sodium: 138 mmol/L (ref 135–145)
TOTAL PROTEIN: 6.5 g/dL (ref 6.5–8.1)

## 2015-10-20 LAB — CBC
HEMATOCRIT: 36.2 % (ref 36.0–46.0)
HEMOGLOBIN: 12.2 g/dL (ref 12.0–15.0)
MCH: 31.1 pg (ref 26.0–34.0)
MCHC: 33.7 g/dL (ref 30.0–36.0)
MCV: 92.3 fL (ref 78.0–100.0)
Platelets: 151 10*3/uL (ref 150–400)
RBC: 3.92 MIL/uL (ref 3.87–5.11)
RDW: 12.6 % (ref 11.5–15.5)
WBC: 2.3 10*3/uL — ABNORMAL LOW (ref 4.0–10.5)

## 2015-10-20 LAB — BLOOD GAS, ARTERIAL
Acid-Base Excess: 1.9 mmol/L (ref 0.0–2.0)
BICARBONATE: 25.1 meq/L — AB (ref 20.0–24.0)
Drawn by: 317771
FIO2: 1
O2 SAT: 99.6 %
TCO2: 19.8 mmol/L (ref 0–100)
pCO2 arterial: 54.2 mmHg — ABNORMAL HIGH (ref 35.0–45.0)
pH, Arterial: 7.323 — ABNORMAL LOW (ref 7.350–7.450)
pO2, Arterial: 322 mmHg — ABNORMAL HIGH (ref 80.0–100.0)

## 2015-10-20 LAB — MRSA PCR SCREENING: MRSA by PCR: NEGATIVE

## 2015-10-20 MED ORDER — ASPIRIN EC 81 MG PO TBEC
81.0000 mg | DELAYED_RELEASE_TABLET | Freq: Every day | ORAL | Status: DC
Start: 2015-10-20 — End: 2015-10-22
  Administered 2015-10-20 – 2015-10-22 (×3): 81 mg via ORAL
  Filled 2015-10-20 (×3): qty 1

## 2015-10-20 MED ORDER — ONDANSETRON HCL 4 MG/2ML IJ SOLN: 4.0000 mg | Freq: Four times a day (QID) | INTRAMUSCULAR | Status: DC | PRN

## 2015-10-20 MED ORDER — IPRATROPIUM-ALBUTEROL 0.5-2.5 (3) MG/3ML IN SOLN
3.0000 mL | Freq: Four times a day (QID) | RESPIRATORY_TRACT | Status: DC
  Administered 2015-10-20 (×3): 3 mL via RESPIRATORY_TRACT
  Filled 2015-10-20 (×3): qty 3

## 2015-10-20 MED ORDER — DEXTROSE 5 % IV SOLN
INTRAVENOUS | Status: AC
  Filled 2015-10-20: qty 500

## 2015-10-20 MED ORDER — IPRATROPIUM BROMIDE 0.02 % IN SOLN
0.5000 mg | Freq: Once | RESPIRATORY_TRACT | Status: AC
Start: 2015-10-20 — End: 2015-10-20
  Administered 2015-10-20: 0.5 mg via RESPIRATORY_TRACT
  Filled 2015-10-20: qty 2.5

## 2015-10-20 MED ORDER — ALPRAZOLAM 0.25 MG PO TABS: 0.2500 mg | ORAL_TABLET | Freq: Every evening | ORAL | Status: DC | PRN

## 2015-10-20 MED ORDER — IPRATROPIUM-ALBUTEROL 0.5-2.5 (3) MG/3ML IN SOLN
3.0000 mL | Freq: Three times a day (TID) | RESPIRATORY_TRACT | Status: DC
  Administered 2015-10-21 – 2015-10-22 (×4): 3 mL via RESPIRATORY_TRACT
  Filled 2015-10-20 (×5): qty 3

## 2015-10-20 MED ORDER — ALBUTEROL SULFATE (2.5 MG/3ML) 0.083% IN NEBU: 2.5000 mg | INHALATION_SOLUTION | RESPIRATORY_TRACT | Status: DC | PRN

## 2015-10-20 MED ORDER — SODIUM CHLORIDE 0.9 % IV SOLN
INTRAVENOUS | Status: DC
  Administered 2015-10-20: 05:00:00 via INTRAVENOUS

## 2015-10-20 MED ORDER — ATENOLOL 25 MG PO TABS
50.0000 mg | ORAL_TABLET | Freq: Every day | ORAL | Status: DC
  Administered 2015-10-20 – 2015-10-22 (×2): 50 mg via ORAL
  Filled 2015-10-20 (×3): qty 2

## 2015-10-20 MED ORDER — ONDANSETRON HCL 4 MG PO TABS: 4.0000 mg | ORAL_TABLET | Freq: Four times a day (QID) | ORAL | Status: DC | PRN

## 2015-10-20 MED ORDER — ALPRAZOLAM 0.25 MG PO TABS
0.2500 mg | ORAL_TABLET | Freq: Three times a day (TID) | ORAL | Status: DC | PRN
  Administered 2015-10-20 (×2): 0.25 mg via ORAL
  Filled 2015-10-20 (×2): qty 1

## 2015-10-20 MED ORDER — ALBUTEROL SULFATE (2.5 MG/3ML) 0.083% IN NEBU
2.5000 mg | INHALATION_SOLUTION | Freq: Four times a day (QID) | RESPIRATORY_TRACT | Status: DC
Start: 2015-10-20 — End: 2015-10-20

## 2015-10-20 MED ORDER — BENZONATATE 100 MG PO CAPS
100.0000 mg | ORAL_CAPSULE | Freq: Once | ORAL | Status: AC
  Administered 2015-10-20: 100 mg via ORAL
  Filled 2015-10-20: qty 1

## 2015-10-20 MED ORDER — ENOXAPARIN SODIUM 40 MG/0.4ML ~~LOC~~ SOLN
40.0000 mg | SUBCUTANEOUS | Status: DC
  Administered 2015-10-20 – 2015-10-22 (×3): 40 mg via SUBCUTANEOUS
  Filled 2015-10-20 (×3): qty 0.4

## 2015-10-20 MED ORDER — THEOPHYLLINE ER 300 MG PO TB12
300.0000 mg | ORAL_TABLET | Freq: Every day | ORAL | Status: DC
  Administered 2015-10-20 – 2015-10-22 (×3): 300 mg via ORAL
  Filled 2015-10-20 (×3): qty 1

## 2015-10-20 MED ORDER — IPRATROPIUM BROMIDE 0.02 % IN SOLN: 0.5000 mg | Freq: Four times a day (QID) | RESPIRATORY_TRACT | Status: DC

## 2015-10-20 MED ORDER — ACETAMINOPHEN 325 MG PO TABS
650.0000 mg | ORAL_TABLET | Freq: Four times a day (QID) | ORAL | Status: DC | PRN
  Administered 2015-10-21 – 2015-10-22 (×3): 650 mg via ORAL
  Filled 2015-10-20 (×2): qty 2

## 2015-10-20 MED ORDER — ALBUTEROL (5 MG/ML) CONTINUOUS INHALATION SOLN
15.0000 mg/h | INHALATION_SOLUTION | RESPIRATORY_TRACT | Status: DC
  Administered 2015-10-20: 15 mg/h via RESPIRATORY_TRACT

## 2015-10-20 MED ORDER — METHYLPREDNISOLONE SODIUM SUCC 125 MG IJ SOLR
60.0000 mg | Freq: Four times a day (QID) | INTRAMUSCULAR | Status: DC
  Administered 2015-10-20 – 2015-10-22 (×10): 60 mg via INTRAVENOUS
  Filled 2015-10-20 (×10): qty 2

## 2015-10-20 MED ORDER — DEXTROSE 5 % IV SOLN
500.0000 mg | INTRAVENOUS | Status: DC
  Administered 2015-10-20 – 2015-10-22 (×3): 500 mg via INTRAVENOUS
  Filled 2015-10-20 (×4): qty 500

## 2015-10-20 MED ORDER — IPRATROPIUM-ALBUTEROL 0.5-2.5 (3) MG/3ML IN SOLN: 3.0000 mL | Freq: Once | RESPIRATORY_TRACT | Status: DC

## 2015-10-20 MED ORDER — GUAIFENESIN ER 600 MG PO TB12
1200.0000 mg | ORAL_TABLET | Freq: Two times a day (BID) | ORAL | Status: DC
  Administered 2015-10-20 – 2015-10-22 (×5): 1200 mg via ORAL
  Filled 2015-10-20 (×5): qty 2

## 2015-10-20 MED ORDER — ACETAMINOPHEN 650 MG RE SUPP: 650.0000 mg | Freq: Four times a day (QID) | RECTAL | Status: DC | PRN

## 2015-10-20 MED ORDER — IPRATROPIUM-ALBUTEROL 0.5-2.5 (3) MG/3ML IN SOLN: 3.0000 mL | RESPIRATORY_TRACT | Status: DC

## 2015-10-20 MED ORDER — GUAIFENESIN 100 MG/5ML PO SOLN
5.0000 mL | ORAL | Status: DC | PRN
  Administered 2015-10-20: 100 mg via ORAL
  Filled 2015-10-20: qty 5

## 2015-10-20 MED ORDER — PRAVASTATIN SODIUM 40 MG PO TABS
40.0000 mg | ORAL_TABLET | Freq: Every day | ORAL | Status: DC
  Administered 2015-10-20 – 2015-10-22 (×3): 40 mg via ORAL
  Filled 2015-10-20 (×3): qty 1

## 2015-10-20 NOTE — Care Management Note (Signed)
Case Management Note  Patient Details  Name: Sarah Rich MRN: [id number] Date of Birth: 01-Oct-1945  Subjective/Objective:                  Pt admitted with COPD. Pt is from home, lives alone and has friends and family for support. Pt has no HH prior to admission. Pt has neb machine prior to admission but no home O2. Pt feels she will need O2 at DC. Pt explained home O2 assessment process and medicare requirements.  Pt verbalizes understanding. Plans to return home with self care.   Action/Plan: Will need home O2 assessment prior to DC. Will cont to follow.   Expected Discharge Date:  10/23/15               Expected Discharge Plan:  Home/Self Care  In-House Referral:  NA  Discharge planning Services  CM Consult  Post Acute Care Choice:  Durable Medical Equipment Choice offered to:  NA, Patient  DME Arranged:    DME Agency:     HH Arranged:    [place] Agency:     Status of Service:  In process, will continue to follow  Medicare Important Message Given:    Date Medicare IM Given:    Medicare IM give by:    Date Additional Medicare IM Given:    Additional Medicare Important Message give by:     If discussed at [place] of Stay Meetings, dates discussed:    Additional Comments:  Sherald Barge, RN 10/20/2015, 3:48 PM
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
## 2015-10-20 NOTE — Progress Notes (Signed)
Patient with orders to be transported to Unit 300. Report called to Lattie Haw, Therapist, sports. Patient stable. Patient transported to room 330.

## 2015-10-20 NOTE — Care Management Obs Status (Signed)
[place] NOTIFICATION   Patient Details  Name: Sarah Rich MRN: [id number] Date of Birth: 25-Jun-1946   Medicare Observation Status Notification Given:  Yes    Sherald Barge, RN 10/20/2015, 3:45 PM

## 2015-10-20 NOTE — Progress Notes (Signed)
Patient seen and examined, chart reviewed. Patient admitted earlier this a.m. for shortness of breath. She is believed to have COPD with acute exacerbation. Agree with azithromycin, nebs, steroids. Will transfer to floor, hopefully DC home over the next 24-48 hours.  Domingo Mend, MD [facility] Pager: [phone]

## 2015-10-21 DIAGNOSIS — I10 Essential (primary) hypertension: Secondary | ICD-10-CM

## 2015-10-21 LAB — THEOPHYLLINE LEVEL: Theophylline Lvl: 5.3 ug/mL — ABNORMAL LOW (ref 10.0–20.0)

## 2015-10-21 NOTE — Progress Notes (Signed)
Atenolol and theodur due. BP 93/71. MD notified and aware.  Ordered to give theodur and hold atenolol. RN will continue to monitor. Oswald Hillock, RN
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
## 2015-10-22 MED ORDER — PREDNISONE 10 MG (21) PO TBPK: ORAL_TABLET | ORAL | Status: DC

## 2015-10-22 MED ORDER — CEFUROXIME AXETIL 500 MG PO TABS: 500.0000 mg | ORAL_TABLET | Freq: Two times a day (BID) | ORAL | Status: DC

## 2015-10-22 NOTE — Care Management Note (Addendum)
Case Management Note  Patient Details  Name: Sarah Rich MRN: [id number] Date of Birth: 1946/08/05  Subjective/Objective:      Spoke with patient and daughter in law at the bedside. Patient is alert and answers questions appropriately,   Patient wil likely need home O2 patient would like Home Health through [facility]. From home alone independent has access to a walker and cane.          Action/Plan:  Home with O2 and RN  Expected Discharge Date:  10/23/15               Expected Discharge Plan:  [place]  In-House Referral:  NA  Discharge planning Services  CM Consult  Post Acute Care Choice:  Durable Medical Equipment Choice offered to:  NA, Patient  DME Arranged:    DME Agency:  [facility] Arranged:  RN [facility] Agency:     Status of Service:  In process, will continue to follow  Medicare Important Message Given:    Date Medicare IM Given:    Medicare IM give by:    Date Additional Medicare IM Given:    Additional Medicare Important Message give by:     If discussed at [place] of Stay Meetings, dates discussed:    Additional Comments: referrall placed with [facility] for DME O2 and home health nursing.  Alvie Heidelberg, RN 10/22/2015, 11:29 AM

## 2015-10-22 NOTE — Progress Notes (Signed)
SATURATION QUALIFICATIONS: (This note is used to comply with regulatory documentation for home oxygen)  Patient Saturations on Room Air at Rest =93 %  Patient Saturations on Room Air while Ambulating = 81%  Patient Saturations on 2 Liters of oxygen while Ambulating = 96%  Please briefly explain why patient needs home oxygen:
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
## 2015-10-22 NOTE — Progress Notes (Addendum)
Discharge instructions given on medications,and follow up visits,patient verbalized understanding. Prescription sent to Pharmacy of choice documented on AVS, a paper copy also given too. No c/o pain or discomfort noted. Accompanied by staff to an awaiting vehicle.

## 2015-10-23 ENCOUNTER — Emergency Department (HOSPITAL_COMMUNITY): Payer: Medicare Other

## 2015-10-23 ENCOUNTER — Observation Stay (HOSPITAL_COMMUNITY)
Admission: EM | Admit: 2015-10-23 | Discharge: 2015-10-25 | Disposition: A | Discharge status: Home / Self Care | Payer: Medicare Other | Attending: Internal Medicine | Admitting: Internal Medicine

## 2015-10-23 ENCOUNTER — Encounter (HOSPITAL_COMMUNITY): Payer: Self-pay | Admitting: *Deleted

## 2015-10-23 DIAGNOSIS — N200 Calculus of kidney: Secondary | ICD-10-CM | POA: Insufficient documentation

## 2015-10-23 DIAGNOSIS — E7800 Pure hypercholesterolemia, unspecified: Secondary | ICD-10-CM | POA: Diagnosis not present

## 2015-10-23 DIAGNOSIS — F17210 Nicotine dependence, cigarettes, uncomplicated: Secondary | ICD-10-CM | POA: Diagnosis not present

## 2015-10-23 DIAGNOSIS — Z79899 Other long term (current) drug therapy: Secondary | ICD-10-CM | POA: Insufficient documentation

## 2015-10-23 DIAGNOSIS — Z7982 Long term (current) use of aspirin: Secondary | ICD-10-CM | POA: Diagnosis not present

## 2015-10-23 DIAGNOSIS — I10 Essential (primary) hypertension: Secondary | ICD-10-CM | POA: Diagnosis present

## 2015-10-23 DIAGNOSIS — J9601 Acute respiratory failure with hypoxia: Secondary | ICD-10-CM | POA: Diagnosis present

## 2015-10-23 DIAGNOSIS — J441 Chronic obstructive pulmonary disease with (acute) exacerbation: Secondary | ICD-10-CM | POA: Diagnosis not present

## 2015-10-23 DIAGNOSIS — R0602 Shortness of breath: Secondary | ICD-10-CM | POA: Diagnosis present

## 2015-10-23 LAB — BASIC METABOLIC PANEL
ANION GAP: 9 (ref 5–15)
BUN: 31 mg/dL — ABNORMAL HIGH (ref 6–20)
CALCIUM: 9 mg/dL (ref 8.9–10.3)
CO2: 31 mmol/L (ref 22–32)
Chloride: 99 mmol/L — ABNORMAL LOW (ref 101–111)
Creatinine, Ser: 0.88 mg/dL (ref 0.44–1.00)
Glucose, Bld: 150 mg/dL — ABNORMAL HIGH (ref 65–99)
Potassium: 3.9 mmol/L (ref 3.5–5.1)
Sodium: 139 mmol/L (ref 135–145)

## 2015-10-23 LAB — CBC WITH DIFFERENTIAL/PLATELET
BASOS ABS: 0 10*3/uL (ref 0.0–0.1)
BASOS PCT: 0 %
Eosinophils Absolute: 0 10*3/uL (ref 0.0–0.7)
Eosinophils Relative: 0 %
HCT: 45.8 % (ref 36.0–46.0)
Hemoglobin: 15.2 g/dL — ABNORMAL HIGH (ref 12.0–15.0)
Lymphocytes Relative: 14 %
Lymphs Abs: 1.4 10*3/uL (ref 0.7–4.0)
MCH: 30.8 pg (ref 26.0–34.0)
MCHC: 33.2 g/dL (ref 30.0–36.0)
MCV: 92.9 fL (ref 78.0–100.0)
Monocytes Absolute: 0.6 10*3/uL (ref 0.1–1.0)
Monocytes Relative: 6 %
NEUTROS ABS: 8.1 10*3/uL — AB (ref 1.7–7.7)
NEUTROS PCT: 80 %
PLATELETS: 272 10*3/uL (ref 150–400)
RBC: 4.93 MIL/uL (ref 3.87–5.11)
RDW: 12.6 % (ref 11.5–15.5)
WBC: 10.1 10*3/uL (ref 4.0–10.5)

## 2015-10-23 MED ORDER — METHYLPREDNISOLONE SODIUM SUCC 125 MG IJ SOLR
80.0000 mg | Freq: Once | INTRAMUSCULAR | Status: AC
  Administered 2015-10-23: 80 mg via INTRAVENOUS
  Filled 2015-10-23: qty 2

## 2015-10-23 MED ORDER — IPRATROPIUM-ALBUTEROL 0.5-2.5 (3) MG/3ML IN SOLN
3.0000 mL | Freq: Once | RESPIRATORY_TRACT | Status: AC
  Administered 2015-10-23: 3 mL via RESPIRATORY_TRACT

## 2015-10-23 MED ORDER — IPRATROPIUM-ALBUTEROL 0.5-2.5 (3) MG/3ML IN SOLN
RESPIRATORY_TRACT | Status: AC
  Filled 2015-10-23: qty 3

## 2015-10-23 MED ORDER — ALBUTEROL (5 MG/ML) CONTINUOUS INHALATION SOLN
10.0000 mg/h | INHALATION_SOLUTION | Freq: Once | RESPIRATORY_TRACT | Status: AC
  Administered 2015-10-23: 10 mg/h via RESPIRATORY_TRACT
  Filled 2015-10-23: qty 20

## 2015-10-23 NOTE — ED Provider Notes (Signed)
Discussed clinical scenario with Dr. Shanon Brow.  She will consult on patient.  Nat Christen, MD 10/23/15 [phone]

## 2015-10-23 NOTE — ED Notes (Signed)
Pt c/o sob, was discharged from hospital yesterday for sob, copd, pt states that she was sent home on oxygen but continues to be sob, has had breathing tx at home without improvement, pt arrived to er on nrb, continuous coughing in triage,

## 2015-10-23 NOTE — ED Provider Notes (Signed)
CSN: [id number]     Arrival date & time 10/23/15  2034 History   First MD Initiated Contact with Patient 10/23/15 2046     Chief Complaint  Patient presents with  . Shortness of Breath      Patient is a 70 y.o. female presenting with shortness of breath. The history is provided by the patient.  Shortness of Breath Severity:  Moderate Associated symptoms: chest pain, cough and wheezing   Associated symptoms: no abdominal pain and no headaches    patient was discharged yesterday for shortness of breath and COPD exacerbation. She has artery on cefuroxime and steroids. She was also sent home on home oxygen. She's been having increasing sputum production and increasing coughing spells since she got home. States when the coughing gets bad she has trouble breathing. She came in with an episode of this coughing and was unable speak because of it. Family has had to increase the oxygen at home. She was on 2 L has been turned up to 3 L. Has some chest pain also.  Past Medical History  Diagnosis Date  . Hypertension   . Hypercholesteremia   . Asthma   . COPD (chronic obstructive pulmonary disease) ([place])   . Shortness of breath   . Kidney stones    Past Surgical History  Procedure Laterality Date  . Abdominal hysterectomy    . Cholecystectomy    . Exploratory laparotomy    . Right breast cystectomy  1980    benign  . Colonoscopy  05/28/2012    Procedure: COLONOSCOPY;  Surgeon: Danie Binder, MD;  Location: [facility];  Service: Endoscopy;  Laterality: N/A;  9:45 AM   Family History  Problem Relation Age of Onset  . Colon cancer Neg Hx    Social History  Substance Use Topics  . Smoking status: Current Every Day Smoker -- 0.25 packs/day for 50 years    Types: Cigarettes  . Smokeless tobacco: None  . Alcohol Use: 1.5 oz/week    3 Standard drinks or equivalent per week   OB History    No data available     Review of Systems  Constitutional: Negative for appetite change.   Respiratory: Positive for cough, shortness of breath and wheezing.   Cardiovascular: Positive for chest pain.  Gastrointestinal: Negative for abdominal pain.  Genitourinary: Negative for dysuria.  Musculoskeletal: Negative for back pain.  Skin: Negative for wound.  Neurological: Negative for headaches.  Hematological: Negative for adenopathy.      Allergies  Sulfa antibiotics; Codeine; and Levaquin  Home Medications   Prior to Admission medications   Medication Sig Start Date End Date Taking? Authorizing Provider  acetaminophen (TYLENOL) 500 MG tablet Take 500 mg by mouth every 6 (six) hours as needed for mild pain, moderate pain or headache. Only as needed   Yes Historical Provider, MD  albuterol (PROVENTIL HFA;VENTOLIN HFA) 108 (90 BASE) MCG/ACT inhaler Inhale 2 puffs into the lungs every 6 (six) hours as needed. Shortness of Breath   Yes Historical Provider, MD  ALPRAZolam (XANAX) 0.5 MG tablet Take 0.025 mg by mouth at bedtime as needed. Pt takes 1/2 tablet at bed time   Yes Historical Provider, MD  aspirin 81 MG tablet Take 81 mg by mouth daily.   Yes Historical Provider, MD  cefUROXime (CEFTIN) 500 MG tablet Take 1 tablet (500 mg total) by mouth 2 (two) times daily with a meal. 10/22/15  Yes Orvan Falconer, MD  cholecalciferol (VITAMIN D) 1000 UNITS tablet  Take 1,000 Units by mouth daily.   Yes Historical Provider, MD  hydroxypropyl methylcellulose (ISOPTO TEARS) 2.5 % ophthalmic solution Place 1 drop into both eyes 3 (three) times daily as needed. Reported on 10/19/2015   Yes Historical Provider, MD  levofloxacin (LEVAQUIN) 750 MG tablet Take 750 mg by mouth daily. 10 day course prescribed 10/19/2015, started 10/23/2015 10/19/15  Yes Historical Provider, MD  Multiple Vitamin (MULTIVITAMIN) tablet Take 1 tablet by mouth daily.   Yes Historical Provider, MD  pravastatin (PRAVACHOL) 40 MG tablet Take 40 mg by mouth daily.   Yes Historical Provider, MD  predniSONE (STERAPRED UNI-PAK 21 TAB) 10  MG (21) TBPK tablet Use as directed. 10/22/15  Yes Orvan Falconer, MD  SPIRIVA HANDIHALER 18 MCG inhalation capsule Place 18 mcg into inhaler and inhale daily.  09/29/15  Yes Historical Provider, MD  theophylline (THEODUR) 300 MG 12 hr tablet Take 300 mg by mouth daily. 09/28/15  Yes Historical Provider, MD  vitamin B-12 (CYANOCOBALAMIN) 1000 MCG tablet Take 1,000 mcg by mouth daily.   Yes Historical Provider, MD   BP 121/77 mmHg  Pulse 87  Temp(Src) 97.6 F (36.4 C) (Temporal)  Resp 15  Ht '5\' 8"'$  (1.727 m)  Wt 134 lb (60.782 kg)  BMI 20.38 kg/m2  SpO2 97% Physical Exam  Constitutional: She appears well-developed.  HENT:  Head: Atraumatic.  Eyes: Pupils are equal, round, and reactive to light.  Neck: Neck supple.  Cardiovascular:  Mild tachycardia  Pulmonary/Chest: She is in respiratory distress. She has wheezes.  Patient with diffuse wheezes and prolonged expirations. Some rest for distress during the coughing episode.  Abdominal: Soft.  Musculoskeletal: Normal range of motion.  Neurological: She is alert.  Skin: Skin is warm.    ED Course  Procedures (including critical care time) Labs Review Labs Reviewed  BASIC METABOLIC PANEL  CBC WITH DIFFERENTIAL/PLATELET    Imaging Review Dg Chest Portable 1 View  10/23/2015  CLINICAL DATA:  Shortness of breath EXAM: PORTABLE CHEST 1 VIEW COMPARISON:  10/19/2015 chest radiograph. FINDINGS: Stable cardiomediastinal silhouette with normal heart size. No pneumothorax. No pleural effusion. Lungs appear clear, with no acute consolidative airspace disease and no pulmonary edema. IMPRESSION: No active disease. Electronically Signed   By: Ilona Sorrel M.D.   On: 10/23/2015 21:20   I have personally reviewed and evaluated these images and lab results as part of my medical decision-making.   EKG Interpretation None      MDM   Final diagnoses:  COPD exacerbation ([place])    Patient with shortness of breath and cough. Discharged yesterday for  same. Now on home oxygen. Has been doing poorly with worsening sputum production. Has been having difficulty breathing when she has coughing spells. X-ray stable. Will check lab work and give longer breathing treatment. Patient was artery on antibiotics and steroids at home. Care turned over to Dr Renae Fickle, MD 10/23/15 2217

## 2015-10-24 DIAGNOSIS — E7800 Pure hypercholesterolemia, unspecified: Secondary | ICD-10-CM | POA: Diagnosis present

## 2015-10-24 DIAGNOSIS — I10 Essential (primary) hypertension: Secondary | ICD-10-CM | POA: Diagnosis not present

## 2015-10-24 DIAGNOSIS — J9601 Acute respiratory failure with hypoxia: Secondary | ICD-10-CM | POA: Diagnosis not present

## 2015-10-24 DIAGNOSIS — J441 Chronic obstructive pulmonary disease with (acute) exacerbation: Secondary | ICD-10-CM | POA: Diagnosis not present

## 2015-10-24 MED ORDER — PRAVASTATIN SODIUM 40 MG PO TABS
40.0000 mg | ORAL_TABLET | Freq: Every day | ORAL | Status: DC
  Administered 2015-10-24 – 2015-10-25 (×2): 40 mg via ORAL
  Filled 2015-10-24 (×2): qty 1

## 2015-10-24 MED ORDER — CEFUROXIME AXETIL 250 MG PO TABS
500.0000 mg | ORAL_TABLET | Freq: Two times a day (BID) | ORAL | Status: DC
  Administered 2015-10-24 – 2015-10-25 (×3): 500 mg via ORAL
  Filled 2015-10-24 (×3): qty 2

## 2015-10-24 MED ORDER — DIAZEPAM 2 MG PO TABS
2.0000 mg | ORAL_TABLET | Freq: Three times a day (TID) | ORAL | Status: DC
  Administered 2015-10-24 – 2015-10-25 (×5): 2 mg via ORAL
  Filled 2015-10-24 (×5): qty 1

## 2015-10-24 MED ORDER — ALPRAZOLAM 0.5 MG PO TABS: 0.2500 mg | ORAL_TABLET | Freq: Every evening | ORAL | Status: DC | PRN

## 2015-10-24 MED ORDER — ONDANSETRON HCL 4 MG/2ML IJ SOLN: 4.0000 mg | Freq: Four times a day (QID) | INTRAMUSCULAR | Status: DC | PRN

## 2015-10-24 MED ORDER — IPRATROPIUM-ALBUTEROL 0.5-2.5 (3) MG/3ML IN SOLN
3.0000 mL | Freq: Four times a day (QID) | RESPIRATORY_TRACT | Status: DC
Start: 2015-10-24 — End: 2015-10-25
  Administered 2015-10-24 – 2015-10-25 (×5): 3 mL via RESPIRATORY_TRACT
  Filled 2015-10-24 (×6): qty 3

## 2015-10-24 MED ORDER — LEVOFLOXACIN 750 MG PO TABS: 750.0000 mg | ORAL_TABLET | Freq: Every day | ORAL | Status: DC

## 2015-10-24 MED ORDER — ASPIRIN 81 MG PO CHEW
81.0000 mg | CHEWABLE_TABLET | Freq: Every day | ORAL | Status: DC
  Administered 2015-10-24 – 2015-10-25 (×2): 81 mg via ORAL
  Filled 2015-10-24 (×2): qty 1

## 2015-10-24 MED ORDER — TIOTROPIUM BROMIDE MONOHYDRATE 18 MCG IN CAPS
18.0000 ug | ORAL_CAPSULE | Freq: Every day | RESPIRATORY_TRACT | Status: DC
  Administered 2015-10-25: 18 ug via RESPIRATORY_TRACT
  Filled 2015-10-24: qty 5

## 2015-10-24 MED ORDER — VITAMIN B-12 1000 MCG PO TABS
1000.0000 ug | ORAL_TABLET | Freq: Every day | ORAL | Status: DC
  Administered 2015-10-24 – 2015-10-25 (×2): 1000 ug via ORAL
  Filled 2015-10-24 (×2): qty 1

## 2015-10-24 MED ORDER — ALBUTEROL SULFATE (2.5 MG/3ML) 0.083% IN NEBU: 2.5000 mg | INHALATION_SOLUTION | Freq: Four times a day (QID) | RESPIRATORY_TRACT | Status: DC

## 2015-10-24 MED ORDER — IPRATROPIUM BROMIDE 0.02 % IN SOLN: 0.5000 mg | Freq: Four times a day (QID) | RESPIRATORY_TRACT | Status: DC

## 2015-10-24 MED ORDER — PREDNISONE 20 MG PO TABS
60.0000 mg | ORAL_TABLET | Freq: Every day | ORAL | Status: DC
  Administered 2015-10-24 – 2015-10-25 (×2): 60 mg via ORAL
  Filled 2015-10-24: qty 6
  Filled 2015-10-24: qty 3

## 2015-10-24 MED ORDER — SODIUM CHLORIDE 0.9% FLUSH
3.0000 mL | Freq: Two times a day (BID) | INTRAVENOUS | Status: DC
  Administered 2015-10-24 – 2015-10-25 (×4): 3 mL via INTRAVENOUS

## 2015-10-24 MED ORDER — ACETAMINOPHEN 500 MG PO TABS: 500.0000 mg | ORAL_TABLET | Freq: Four times a day (QID) | ORAL | Status: DC | PRN

## 2015-10-24 MED ORDER — ALUM & MAG HYDROXIDE-SIMETH 200-200-20 MG/5ML PO SUSP: 30.0000 mL | Freq: Four times a day (QID) | ORAL | Status: DC | PRN

## 2015-10-24 MED ORDER — VITAMIN D 1000 UNITS PO TABS
1000.0000 [IU] | ORAL_TABLET | Freq: Every day | ORAL | Status: DC
  Administered 2015-10-24 – 2015-10-25 (×2): 1000 [IU] via ORAL
  Filled 2015-10-24 (×2): qty 1

## 2015-10-24 MED ORDER — SODIUM CHLORIDE 0.9% FLUSH: 3.0000 mL | INTRAVENOUS | Status: DC | PRN

## 2015-10-24 MED ORDER — ALBUTEROL SULFATE (2.5 MG/3ML) 0.083% IN NEBU: 2.5000 mg | INHALATION_SOLUTION | RESPIRATORY_TRACT | Status: DC | PRN

## 2015-10-24 MED ORDER — ADULT MULTIVITAMIN W/MINERALS CH
1.0000 | ORAL_TABLET | Freq: Every day | ORAL | Status: DC
  Administered 2015-10-24 – 2015-10-25 (×2): 1 via ORAL
  Filled 2015-10-24 (×2): qty 1

## 2015-10-24 MED ORDER — ONDANSETRON HCL 4 MG PO TABS: 4.0000 mg | ORAL_TABLET | Freq: Four times a day (QID) | ORAL | Status: DC | PRN

## 2015-10-24 MED ORDER — THEOPHYLLINE ER 300 MG PO TB12
300.0000 mg | ORAL_TABLET | Freq: Every day | ORAL | Status: DC
  Administered 2015-10-24 – 2015-10-25 (×2): 300 mg via ORAL
  Filled 2015-10-24 (×2): qty 1

## 2015-10-24 MED ORDER — GUAIFENESIN-DM 100-10 MG/5ML PO SYRP
5.0000 mL | ORAL_SOLUTION | ORAL | Status: DC | PRN
  Administered 2015-10-24: 5 mL via ORAL
  Filled 2015-10-24: qty 5

## 2015-10-24 MED ORDER — HYDROCOD POLST-CPM POLST ER 10-8 MG/5ML PO SUER
5.0000 mL | Freq: Two times a day (BID) | ORAL | Status: DC
  Administered 2015-10-24 – 2015-10-25 (×3): 5 mL via ORAL
  Filled 2015-10-24 (×3): qty 5

## 2015-10-24 MED ORDER — SODIUM CHLORIDE 0.9 % IV SOLN: 250.0000 mL | INTRAVENOUS | Status: DC | PRN

## 2015-10-24 NOTE — ED Notes (Signed)
Admitting MD at bedside.

## 2015-10-24 NOTE — Progress Notes (Addendum)
Patient whom I discharged yesterday after Tx for COPD exacerbation, PNA, and started oxygen, having anxiety and SOB, with coughing, admitted and continued on same regimen.  She was given cough suppressant.  She also has anxiety and some panic attack.  Will continue with current Tx.  Continue with antitussive and anxiolytics along with steroid for wheezing.   She is stable.

## 2015-10-24 NOTE — Progress Notes (Signed)
Pt admitted from ED to 3A 329 via stretcher with family to bedside.  Pt ambulates from stretcher to bed with O2 2L/Min via nasal cannula.  Pt in no acute distress at rest with noted dyspnea and peripheral cyanosis noted with exertion.  Pt oriented to room environment.  See admission assessment for further details.
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
## 2015-10-24 NOTE — ED Notes (Signed)
Ambulated pt around department as ordered. Pt is on chronic 2 liters at home. Ambulated pt in hallway on 2 liters, pt with steady gait. O2 saturations between 81-88% on 2 liters, hr 100 -116 during ambulation. On return to room, pt started coughing and saying she felt like she was not getting any oxygen. Assisted pt back to the bed, encouraged deep breathing. Pt with short, gasping breaths, continuing to says she could not breath. O2 saturations on 2 liters began to drop into the low 80's, increased O 2 via nasal cannula without improvement. Placed pt on non-rebreather with improved O2 saturations (mid 90's). Will wean pt as appropriate back to [place]. Called Dr. Shanon Brow (admitting) and notified her of the same, plan to admit at this time, notified pt/family of the same.

## 2015-10-25 DIAGNOSIS — J441 Chronic obstructive pulmonary disease with (acute) exacerbation: Secondary | ICD-10-CM | POA: Diagnosis not present

## 2015-10-25 DIAGNOSIS — J9601 Acute respiratory failure with hypoxia: Secondary | ICD-10-CM

## 2015-10-25 DIAGNOSIS — E7800 Pure hypercholesterolemia, unspecified: Secondary | ICD-10-CM | POA: Diagnosis not present

## 2015-10-25 MED ORDER — DIAZEPAM 2 MG PO TABS
2.0000 mg | ORAL_TABLET | Freq: Three times a day (TID) | ORAL | Status: DC
Start: 2015-10-25 — End: 2017-11-12

## 2015-10-25 MED ORDER — HYDROCOD POLST-CPM POLST ER 10-8 MG/5ML PO SUER: 5.0000 mL | Freq: Two times a day (BID) | ORAL | Status: DC

## 2015-10-25 MED ORDER — IPRATROPIUM-ALBUTEROL 0.5-2.5 (3) MG/3ML IN SOLN: 3.0000 mL | Freq: Four times a day (QID) | RESPIRATORY_TRACT | Status: DC
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
## 2015-10-25 NOTE — Progress Notes (Signed)
Pt's IV catheter removed and intact. Pt's IV site clean dry and intact. Discharge instructions including follow up appointments and medications were reviewed with patient. Pt verbalized understanding of discharge instructions. All questions were answered and no further questions at this time. Pt in stable condition and in no acute distress at this time. Pt escorted by nurse tech.

## 2016-01-27 ENCOUNTER — Other Ambulatory Visit (HOSPITAL_COMMUNITY): Payer: Self-pay | Admitting: Pulmonary Disease

## 2016-01-27 ENCOUNTER — Ambulatory Visit (HOSPITAL_COMMUNITY)
Admission: RE | Admit: 2016-01-27 | Discharge: 2016-01-27 | Disposition: A | Discharge status: Home / Self Care | Payer: Medicare Other | Source: Ambulatory Visit | Attending: Pulmonary Disease | Admitting: Pulmonary Disease

## 2016-01-27 DIAGNOSIS — S0990XA Unspecified injury of head, initial encounter: Secondary | ICD-10-CM | POA: Insufficient documentation

## 2016-01-27 DIAGNOSIS — W19XXXA Unspecified fall, initial encounter: Secondary | ICD-10-CM | POA: Diagnosis not present

## 2017-04-17 ENCOUNTER — Ambulatory Visit (HOSPITAL_COMMUNITY)
Admission: RE | Admit: 2017-04-17 | Discharge: 2017-04-17 | Disposition: A | Discharge status: Home / Self Care | Payer: Medicare Other | Source: Ambulatory Visit | Attending: Family Medicine | Admitting: Family Medicine

## 2017-04-17 ENCOUNTER — Other Ambulatory Visit (HOSPITAL_COMMUNITY): Payer: Self-pay | Admitting: Family Medicine

## 2017-04-17 DIAGNOSIS — R9389 Abnormal findings on diagnostic imaging of other specified body structures: Secondary | ICD-10-CM

## 2017-04-17 DIAGNOSIS — R911 Solitary pulmonary nodule: Secondary | ICD-10-CM

## 2017-04-17 DIAGNOSIS — R0781 Pleurodynia: Secondary | ICD-10-CM | POA: Diagnosis not present

## 2017-04-19 ENCOUNTER — Ambulatory Visit (HOSPITAL_COMMUNITY)
Admission: RE | Admit: 2017-04-19 | Discharge: 2017-04-19 | Disposition: A | Discharge status: Home / Self Care | Payer: Medicare Other | Source: Ambulatory Visit | Attending: Family Medicine | Admitting: Family Medicine

## 2017-04-19 DIAGNOSIS — R911 Solitary pulmonary nodule: Secondary | ICD-10-CM | POA: Insufficient documentation

## 2017-04-19 DIAGNOSIS — R938 Abnormal findings on diagnostic imaging of other specified body structures: Secondary | ICD-10-CM | POA: Diagnosis present

## 2017-04-19 DIAGNOSIS — R9389 Abnormal findings on diagnostic imaging of other specified body structures: Secondary | ICD-10-CM

## 2017-04-19 LAB — POCT I-STAT CREATININE: Creatinine, Ser: 0.9 mg/dL (ref 0.44–1.00)

## 2017-04-19 MED ORDER — IOPAMIDOL (ISOVUE-300) INJECTION 61%
75.0000 mL | Freq: Once | INTRAVENOUS | Status: AC | PRN
  Administered 2017-04-19: 75 mL via INTRAVENOUS

## 2017-04-24 ENCOUNTER — Other Ambulatory Visit (HOSPITAL_COMMUNITY): Payer: Self-pay | Admitting: Family Medicine

## 2017-04-24 ENCOUNTER — Other Ambulatory Visit (HOSPITAL_COMMUNITY)
Admission: RE | Admit: 2017-04-24 | Discharge: 2017-04-24 | Disposition: A | Discharge status: Home / Self Care | Payer: Medicare Other | Source: Ambulatory Visit | Attending: Family Medicine | Admitting: Family Medicine

## 2017-04-24 ENCOUNTER — Ambulatory Visit (HOSPITAL_COMMUNITY)
Admission: RE | Admit: 2017-04-24 | Discharge: 2017-04-24 | Disposition: A | Discharge status: Home / Self Care | Payer: Medicare Other | Source: Ambulatory Visit | Attending: Family Medicine | Admitting: Family Medicine

## 2017-04-24 DIAGNOSIS — R911 Solitary pulmonary nodule: Secondary | ICD-10-CM | POA: Diagnosis not present

## 2017-04-24 DIAGNOSIS — J690 Pneumonitis due to inhalation of food and vomit: Secondary | ICD-10-CM

## 2017-04-24 DIAGNOSIS — J189 Pneumonia, unspecified organism: Secondary | ICD-10-CM | POA: Diagnosis present

## 2017-04-24 DIAGNOSIS — J449 Chronic obstructive pulmonary disease, unspecified: Secondary | ICD-10-CM | POA: Diagnosis not present

## 2017-04-24 LAB — CBC WITH DIFFERENTIAL/PLATELET
BASOS ABS: 0 10*3/uL (ref 0.0–0.1)
Basophils Relative: 1 %
EOS PCT: 2 %
Eosinophils Absolute: 0.1 10*3/uL (ref 0.0–0.7)
HCT: 43.3 % (ref 36.0–46.0)
Hemoglobin: 14.5 g/dL (ref 12.0–15.0)
LYMPHS ABS: 1.4 10*3/uL (ref 0.7–4.0)
LYMPHS PCT: 26 %
MCH: 31.2 pg (ref 26.0–34.0)
MCHC: 33.5 g/dL (ref 30.0–36.0)
MCV: 93.1 fL (ref 78.0–100.0)
MONO ABS: 0.5 10*3/uL (ref 0.1–1.0)
Monocytes Relative: 9 %
Neutro Abs: 3.3 10*3/uL (ref 1.7–7.7)
Neutrophils Relative %: 62 %
PLATELETS: 278 10*3/uL (ref 150–400)
RBC: 4.65 MIL/uL (ref 3.87–5.11)
RDW: 12.7 % (ref 11.5–15.5)
WBC: 5.2 10*3/uL (ref 4.0–10.5)

## 2017-05-23 ENCOUNTER — Ambulatory Visit (HOSPITAL_COMMUNITY): Payer: Medicare Other

## 2017-05-23 ENCOUNTER — Encounter (HOSPITAL_COMMUNITY): Payer: Self-pay

## 2017-11-12 ENCOUNTER — Encounter (HOSPITAL_COMMUNITY): Payer: Self-pay | Admitting: *Deleted

## 2017-11-12 ENCOUNTER — Emergency Department (HOSPITAL_COMMUNITY): Payer: Medicare Other

## 2017-11-12 ENCOUNTER — Emergency Department (HOSPITAL_COMMUNITY)
Admission: EM | Admit: 2017-11-12 | Discharge: 2017-11-12 | Disposition: A | Discharge status: Home / Self Care | Payer: Medicare Other | Attending: Emergency Medicine | Admitting: Emergency Medicine

## 2017-11-12 DIAGNOSIS — Z87891 Personal history of nicotine dependence: Secondary | ICD-10-CM | POA: Insufficient documentation

## 2017-11-12 DIAGNOSIS — R0602 Shortness of breath: Secondary | ICD-10-CM | POA: Diagnosis present

## 2017-11-12 DIAGNOSIS — Z7982 Long term (current) use of aspirin: Secondary | ICD-10-CM | POA: Insufficient documentation

## 2017-11-12 DIAGNOSIS — J45909 Unspecified asthma, uncomplicated: Secondary | ICD-10-CM | POA: Insufficient documentation

## 2017-11-12 DIAGNOSIS — Z79899 Other long term (current) drug therapy: Secondary | ICD-10-CM | POA: Diagnosis not present

## 2017-11-12 DIAGNOSIS — J441 Chronic obstructive pulmonary disease with (acute) exacerbation: Secondary | ICD-10-CM | POA: Insufficient documentation

## 2017-11-12 DIAGNOSIS — I10 Essential (primary) hypertension: Secondary | ICD-10-CM | POA: Insufficient documentation

## 2017-11-12 LAB — CBC WITH DIFFERENTIAL/PLATELET
Basophils Absolute: 0 10*3/uL (ref 0.0–0.1)
Basophils Relative: 0 %
Eosinophils Absolute: 0 10*3/uL (ref 0.0–0.7)
Eosinophils Relative: 0 %
HEMATOCRIT: 40.8 % (ref 36.0–46.0)
HEMOGLOBIN: 12.9 g/dL (ref 12.0–15.0)
LYMPHS PCT: 8 %
Lymphs Abs: 0.6 10*3/uL — ABNORMAL LOW (ref 0.7–4.0)
MCH: 29.7 pg (ref 26.0–34.0)
MCHC: 31.6 g/dL (ref 30.0–36.0)
MCV: 93.8 fL (ref 78.0–100.0)
MONO ABS: 0.9 10*3/uL (ref 0.1–1.0)
Monocytes Relative: 12 %
NEUTROS ABS: 6 10*3/uL (ref 1.7–7.7)
NEUTROS PCT: 80 %
Platelets: 184 10*3/uL (ref 150–400)
RBC: 4.35 MIL/uL (ref 3.87–5.11)
RDW: 12.3 % (ref 11.5–15.5)
WBC: 7.6 10*3/uL (ref 4.0–10.5)

## 2017-11-12 LAB — BASIC METABOLIC PANEL
ANION GAP: 11 (ref 5–15)
BUN: 14 mg/dL (ref 6–20)
CHLORIDE: 98 mmol/L — AB (ref 101–111)
CO2: 28 mmol/L (ref 22–32)
Calcium: 9 mg/dL (ref 8.9–10.3)
Creatinine, Ser: 0.74 mg/dL (ref 0.44–1.00)
GFR calc Af Amer: >60 mL/min (ref >60)
GFR calc non Af Amer: >60 mL/min (ref >60)
GLUCOSE: 123 mg/dL — AB (ref 65–99)
POTASSIUM: 3.8 mmol/L (ref 3.5–5.1)
Sodium: 137 mmol/L (ref 135–145)

## 2017-11-12 LAB — BRAIN NATRIURETIC PEPTIDE: B NATRIURETIC PEPTIDE 5: 66 pg/mL (ref 0.0–100.0)

## 2017-11-12 LAB — TROPONIN I: Troponin I: <0.03 ng/mL (ref <0.03)

## 2017-11-12 MED ORDER — BENZONATATE 100 MG PO CAPS
200.0000 mg | ORAL_CAPSULE | Freq: Once | ORAL | Status: AC
  Administered 2017-11-12: 200 mg via ORAL
  Filled 2017-11-12: qty 2

## 2017-11-12 MED ORDER — BENZONATATE 100 MG PO CAPS: 100.0000 mg | ORAL_CAPSULE | Freq: Three times a day (TID) | ORAL | 0 refills | Status: DC

## 2017-11-12 MED ORDER — DOXYCYCLINE HYCLATE 100 MG PO TABS
100.0000 mg | ORAL_TABLET | Freq: Once | ORAL | Status: AC
  Administered 2017-11-12: 100 mg via ORAL
  Filled 2017-11-12: qty 1

## 2017-11-12 MED ORDER — PREDNISONE 20 MG PO TABS: 40.0000 mg | ORAL_TABLET | Freq: Every day | ORAL | 0 refills | Status: DC

## 2017-11-12 MED ORDER — DOXYCYCLINE HYCLATE 100 MG PO CAPS: 100.0000 mg | ORAL_CAPSULE | Freq: Two times a day (BID) | ORAL | 0 refills | Status: DC

## 2017-11-12 MED ORDER — METHYLPREDNISOLONE SODIUM SUCC 125 MG IJ SOLR
125.0000 mg | Freq: Once | INTRAMUSCULAR | Status: AC
  Administered 2017-11-12: 125 mg via INTRAVENOUS
  Filled 2017-11-12: qty 2

## 2017-11-12 MED ORDER — ALBUTEROL (5 MG/ML) CONTINUOUS INHALATION SOLN
10.0000 mg/h | INHALATION_SOLUTION | RESPIRATORY_TRACT | Status: AC
  Administered 2017-11-12: 10 mg/h via RESPIRATORY_TRACT
  Filled 2017-11-12: qty 20

## 2017-11-12 NOTE — Discharge Instructions (Signed)
Albuterol every 4 hours as needed for coughing or wheezing or shortness of breath Prednisone 40 mg a day for 5 days Tessalon, 100 mg every 8 hours as needed for coughing Doxycycline 100 mg twice a day for the next 10 days to treat possible infection Emergency department for severe or worsening difficulty breathing Follow-up with your physician within 48 hours for recheck if no better

## 2017-11-12 NOTE — ED Triage Notes (Signed)
Pt with sob today, hx of copd, denies cp.  Neb tx's today without relief.

## 2017-11-12 NOTE — ED Provider Notes (Signed)
[facility] EMERGENCY DEPARTMENT Provider Note   CSN: [id number] Arrival date & time: 11/12/17  1746     History   Chief Complaint Chief Complaint  Patient presents with  . Shortness of Breath    HPI Sarah Rich is a 72 y.o. female.  HPI  The patient is a 72 year old female, known history of asthma COPD, currently a smoker, also known to have hypertension and hypercholesterolemia.  She has no prior history of cardiac disease.  The patient presents today with increasing shortness of breath, it seemed to get worse over the last couple of days but today has had more shortness of breath, denies chest pain, she has ongoing cough and over the last several weeks has been bringing up thick mucus some of which has been tinged with blood.  She denies fevers, she does endorse some swelling around the ankles.  She has used 2 albuterol treatments today with the help of her son who is a employee of the fire department and is familiar with treating these conditions.  The patient is on home oxygen at 4 L by nasal cannula chronically  Past Medical History:  Diagnosis Date  . Asthma   . COPD (chronic obstructive pulmonary disease) ([place])   . Hypercholesteremia   . Hypertension   . Kidney stones   . Shortness of breath     Patient Active Problem List   Diagnosis Date Noted  . Acute respiratory failure with hypoxemia ([place]) 10/24/2015  . Hypercholesteremia   . COPD exacerbation ([place]) 10/20/2015  . Hypertension 10/20/2015    Past Surgical History:  Procedure Laterality Date  . ABDOMINAL HYSTERECTOMY    . CHOLECYSTECTOMY    . COLONOSCOPY  05/28/2012   Procedure: COLONOSCOPY;  Surgeon: Danie Binder, MD;  Location: [facility];  Service: Endoscopy;  Laterality: N/A;  9:45 AM  . EXPLORATORY LAPAROTOMY    . right breast cystectomy  1980   benign    OB History    No data available       Home Medications    Prior to Admission medications   Medication Sig Start Date End Date  Taking? Authorizing Provider  albuterol (PROVENTIL HFA;VENTOLIN HFA) 108 (90 BASE) MCG/ACT inhaler Inhale 2 puffs into the lungs every 6 (six) hours as needed. Shortness of Breath   Yes [provider]  albuterol (PROVENTIL) (2.5 MG/3ML) 0.083% nebulizer solution Take 2.5 mg by nebulization every 6 (six) hours as needed for wheezing or shortness of breath.   Yes [provider]  aspirin 81 MG tablet Take 81 mg by mouth daily.   Yes [provider]  atenolol (TENORMIN) 50 MG tablet Take 50 mg by mouth daily.   Yes [provider]  cholecalciferol (VITAMIN D) 1000 UNITS tablet Take 1,000 Units by mouth daily.   Yes [provider]  fluticasone (FLONASE) 50 MCG/ACT nasal spray Place 1-2 sprays into both nostrils daily. 08/30/17  Yes [provider]  hydroxypropyl methylcellulose (ISOPTO TEARS) 2.5 % ophthalmic solution Place 1 drop into both eyes 3 (three) times daily as needed. Reported on 10/19/2015   Yes [provider]  pravastatin (PRAVACHOL) 40 MG tablet Take 40 mg by mouth daily.   Yes [provider]  SPIRIVA HANDIHALER 18 MCG inhalation capsule Place 18 mcg into inhaler and inhale daily.  09/29/15  Yes [provider]  vitamin B-12 (CYANOCOBALAMIN) 1000 MCG tablet Take 1,000 mcg by mouth daily.   Yes [provider]  benzonatate (TESSALON) 100  MG capsule Take 1 capsule (100 mg total) by mouth every 8 (eight) hours. 11/12/17   Noemi Chapel, MD  doxycycline (VIBRAMYCIN) 100 MG capsule Take 1 capsule (100 mg total) by mouth 2 (two) times daily. 11/12/17   Noemi Chapel, MD  predniSONE (DELTASONE) 20 MG tablet Take 2 tablets (40 mg total) by mouth daily. 11/12/17   Noemi Chapel, MD    Family History Family History  Problem Relation Age of Onset  . Colon cancer Neg Hx     Social History Social History   Tobacco Use  . Smoking status: Former Smoker    Packs/day: 0.25    Years: 50.00    Pack years:  12.50    Types: Cigarettes  Substance Use Topics  . Alcohol use: Yes    Alcohol/week: 1.5 oz    Types: 3 Standard drinks or equivalent per week  . Drug use: No     Allergies   Sulfa antibiotics; Codeine; and Levaquin [levofloxacin]   Review of Systems Review of Systems  All other systems reviewed and are negative.    Physical Exam Updated Vital Signs BP (!) 149/86   Pulse 97   Temp 98.3 F (36.8 C) (Oral)   Resp (!) 24   Ht 5\' 8"  (1.727 m)   Wt 61.2 kg (135 lb)   SpO2 100%   BMI 20.53 kg/m   Physical Exam  Constitutional: She appears well-developed and well-nourished. She appears distressed.  HENT:  Head: Normocephalic and atraumatic.  Mouth/Throat: Oropharynx is clear and moist. No oropharyngeal exudate.  Eyes: Conjunctivae and EOM are normal. Pupils are equal, round, and reactive to light. Right eye exhibits no discharge. Left eye exhibits no discharge. No scleral icterus.  Neck: Normal range of motion. Neck supple. No JVD present. No thyromegaly present.  Cardiovascular: Normal rate, regular rhythm, normal heart sounds and intact distal pulses. Exam reveals no gallop and no friction rub.  No murmur heard. Pulmonary/Chest: She is in respiratory distress. She has wheezes. She has rales.  Retractions, supraclavicular, speaks in shortened sentences  Abdominal: Soft. Bowel sounds are normal. She exhibits no distension and no mass. There is no tenderness.  Musculoskeletal: Normal range of motion. She exhibits edema ( Bilateral ankle edema symmetrical, no edema above the ankles). She exhibits no tenderness.  Lymphadenopathy:    She has no cervical adenopathy.  Neurological: She is alert. Coordination normal.  Skin: Skin is warm and dry. No rash noted. No erythema.  Psychiatric: She has a normal mood and affect. Her behavior is normal.  Nursing note and vitals reviewed.    ED Treatments / Results  Labs (all labs ordered are listed, but only abnormal results are  displayed) Labs Reviewed  CBC WITH DIFFERENTIAL/PLATELET - Abnormal; Notable for the following components:      Result Value   Lymphs Abs 0.6 (*)    All other components within normal limits  BASIC METABOLIC PANEL - Abnormal; Notable for the following components:   Chloride 98 (*)    Glucose, Bld 123 (*)    All other components within normal limits  TROPONIN I  BRAIN NATRIURETIC PEPTIDE    EKG  EKG Interpretation  Date/Time:  Sunday November 12 2017 17:59:42 EDT Ventricular Rate:  92 PR Interval:    QRS Duration: 100 QT Interval:  360 QTC Calculation: 446 R Axis:   -33 Text Interpretation:  Sinus rhythm Left axis deviation Baseline wander in lead(s) V3 unremarkable overall Since last tracing rate slower Confirmed by Noemi Chapel (  [id number]) on 11/12/2017 8:00:18 PM       Radiology Dg Chest Portable 1 View  Result Date: 11/12/2017 CLINICAL DATA:  SOB, Pt with sob today, hx of copd, denies cpHISTORY OF COPD, HTN, ASTHMA, SOB EXAM: PORTABLE CHEST 1 VIEW COMPARISON:  04/24/2017 FINDINGS: Cardiac silhouette normal in size and configuration. No mediastinal or hilar masses. No evidence of adenopathy. Lungs are hyperexpanded but clear. No pleural effusion or pneumothorax. Skeletal structures are intact. IMPRESSION: No acute cardiopulmonary disease. Electronically Signed   By: Lajean Manes M.D.   On: 11/12/2017 18:26    Procedures Procedures (including critical care time)  Medications Ordered in ED Medications  albuterol (PROVENTIL,VENTOLIN) solution continuous neb (10 mg/hr Nebulization New Bag/Given 11/12/17 1834)  doxycycline (VIBRA-TABS) tablet 100 mg (not administered)  benzonatate (TESSALON) capsule 200 mg (not administered)  methylPREDNISolone sodium succinate (SOLU-MEDROL) 125 mg/2 mL injection 125 mg (125 mg Intravenous Given 11/12/17 1829)     Initial Impression / Assessment and Plan / ED Course  I have reviewed the triage vital signs and the nursing notes.  Pertinent labs  & imaging results that were available during my care of the patient were reviewed by me and considered in my medical decision making (see chart for details).  Clinical Course as of Nov 12 2098  Sun Nov 12, 2017  2000 Labs are unremarkable, no leukocytosis, no findings of pneumonia on x-ray, EKG unremarkable as well.  Patient has been given a continuous nebulizer treatment, reevaluation pending  [BM]    Clinical Course User Index [BM] Noemi Chapel, MD   The patient is ill-appearing with increased work of breathing and respiratory distress.  This could all be COPD though I would also consider pneumonia or CHF is alternative diagnosis.  She does have slight edema of the ankles, no prior history of cardiac disease, labs ordered, chest x-ray ordered, treatments of albuterol continuous over 1 hour ordered.  Patient reevaluated and seems much more comfortable after a continuous nebulizer.  She is now speaking in full sentences and requesting discharge.  After looking at all of her results EKG labs and chest x-ray I think this is a reasonable choice as she is aware of the indications for return and agreeable to do so.  Prior to discharge she was given medications including doxycycline, steroid, albuterol.  Final Clinical Impressions(s) / ED Diagnoses   Final diagnoses:  COPD exacerbation [facility])    ED Discharge Orders        Ordered    doxycycline (VIBRAMYCIN) 100 MG capsule  2 times daily     11/12/17 2058    benzonatate (TESSALON) 100 MG capsule  Every 8 hours     11/12/17 2058    predniSONE (DELTASONE) 20 MG tablet  Daily     11/12/17 2058       Noemi Chapel, MD 11/12/17 2100

## 2018-04-30 ENCOUNTER — Emergency Department (HOSPITAL_COMMUNITY): Payer: Medicare Other

## 2018-04-30 ENCOUNTER — Encounter (HOSPITAL_COMMUNITY): Payer: Self-pay

## 2018-04-30 ENCOUNTER — Other Ambulatory Visit: Payer: Self-pay

## 2018-04-30 ENCOUNTER — Inpatient Hospital Stay (HOSPITAL_COMMUNITY)
Admission: EM | Admit: 2018-04-30 | Discharge: 2018-05-03 | DRG: 199 | Disposition: A | Discharge status: Home / Self Care | Payer: Medicare Other | Attending: Internal Medicine | Admitting: Internal Medicine

## 2018-04-30 DIAGNOSIS — F411 Generalized anxiety disorder: Secondary | ICD-10-CM | POA: Diagnosis present

## 2018-04-30 DIAGNOSIS — R0789 Other chest pain: Secondary | ICD-10-CM | POA: Diagnosis not present

## 2018-04-30 DIAGNOSIS — Z7952 Long term (current) use of systemic steroids: Secondary | ICD-10-CM

## 2018-04-30 DIAGNOSIS — J9811 Atelectasis: Secondary | ICD-10-CM | POA: Diagnosis not present

## 2018-04-30 DIAGNOSIS — I10 Essential (primary) hypertension: Secondary | ICD-10-CM | POA: Diagnosis present

## 2018-04-30 DIAGNOSIS — R0603 Acute respiratory distress: Secondary | ICD-10-CM | POA: Diagnosis present

## 2018-04-30 DIAGNOSIS — Z79899 Other long term (current) drug therapy: Secondary | ICD-10-CM

## 2018-04-30 DIAGNOSIS — J939 Pneumothorax, unspecified: Secondary | ICD-10-CM

## 2018-04-30 DIAGNOSIS — J9383 Other pneumothorax: Principal | ICD-10-CM | POA: Diagnosis present

## 2018-04-30 DIAGNOSIS — F17200 Nicotine dependence, unspecified, uncomplicated: Secondary | ICD-10-CM | POA: Diagnosis present

## 2018-04-30 DIAGNOSIS — Z7982 Long term (current) use of aspirin: Secondary | ICD-10-CM

## 2018-04-30 DIAGNOSIS — R911 Solitary pulmonary nodule: Secondary | ICD-10-CM | POA: Diagnosis present

## 2018-04-30 DIAGNOSIS — J9611 Chronic respiratory failure with hypoxia: Secondary | ICD-10-CM | POA: Diagnosis present

## 2018-04-30 DIAGNOSIS — J449 Chronic obstructive pulmonary disease, unspecified: Secondary | ICD-10-CM | POA: Diagnosis present

## 2018-04-30 DIAGNOSIS — R0602 Shortness of breath: Secondary | ICD-10-CM | POA: Diagnosis present

## 2018-04-30 DIAGNOSIS — C3432 Malignant neoplasm of lower lobe, left bronchus or lung: Secondary | ICD-10-CM | POA: Diagnosis present

## 2018-04-30 DIAGNOSIS — E7800 Pure hypercholesterolemia, unspecified: Secondary | ICD-10-CM | POA: Diagnosis present

## 2018-04-30 DIAGNOSIS — Z9981 Dependence on supplemental oxygen: Secondary | ICD-10-CM

## 2018-04-30 DIAGNOSIS — J9621 Acute and chronic respiratory failure with hypoxia: Secondary | ICD-10-CM | POA: Diagnosis present

## 2018-04-30 DIAGNOSIS — X58XXXA Exposure to other specified factors, initial encounter: Secondary | ICD-10-CM | POA: Diagnosis present

## 2018-04-30 DIAGNOSIS — F17210 Nicotine dependence, cigarettes, uncomplicated: Secondary | ICD-10-CM | POA: Diagnosis present

## 2018-04-30 DIAGNOSIS — T17990A Other foreign object in respiratory tract, part unspecified in causing asphyxiation, initial encounter: Secondary | ICD-10-CM | POA: Diagnosis present

## 2018-04-30 DIAGNOSIS — Z7951 Long term (current) use of inhaled steroids: Secondary | ICD-10-CM

## 2018-04-30 DIAGNOSIS — R0781 Pleurodynia: Secondary | ICD-10-CM | POA: Diagnosis present

## 2018-04-30 DIAGNOSIS — Z90710 Acquired absence of both cervix and uterus: Secondary | ICD-10-CM

## 2018-04-30 DIAGNOSIS — E785 Hyperlipidemia, unspecified: Secondary | ICD-10-CM | POA: Diagnosis present

## 2018-04-30 LAB — TROPONIN I: Troponin I: <0.03 ng/mL (ref <0.03)

## 2018-04-30 LAB — CBC
HEMATOCRIT: 40.9 % (ref 36.0–46.0)
Hemoglobin: 13.2 g/dL (ref 12.0–15.0)
MCH: 29.7 pg (ref 26.0–34.0)
MCHC: 32.3 g/dL (ref 30.0–36.0)
MCV: 92.1 fL (ref 78.0–100.0)
Platelets: 233 10*3/uL (ref 150–400)
RBC: 4.44 MIL/uL (ref 3.87–5.11)
RDW: 12.9 % (ref 11.5–15.5)
WBC: 5.8 10*3/uL (ref 4.0–10.5)

## 2018-04-30 LAB — COMPREHENSIVE METABOLIC PANEL
ALT: 14 U/L (ref 0–44)
AST: 25 U/L (ref 15–41)
Albumin: 3.9 g/dL (ref 3.5–5.0)
Alkaline Phosphatase: 63 U/L (ref 38–126)
Anion gap: 9 (ref 5–15)
BILIRUBIN TOTAL: 1.4 mg/dL — AB (ref 0.3–1.2)
BUN: 20 mg/dL (ref 8–23)
CO2: 30 mmol/L (ref 22–32)
Calcium: 9.1 mg/dL (ref 8.9–10.3)
Chloride: 101 mmol/L (ref 98–111)
Creatinine, Ser: 0.84 mg/dL (ref 0.44–1.00)
GFR calc Af Amer: >60 mL/min (ref >60)
GFR calc non Af Amer: >60 mL/min (ref >60)
GLUCOSE: 89 mg/dL (ref 70–99)
POTASSIUM: 3.9 mmol/L (ref 3.5–5.1)
SODIUM: 140 mmol/L (ref 135–145)
TOTAL PROTEIN: 7 g/dL (ref 6.5–8.1)

## 2018-04-30 LAB — MRSA PCR SCREENING: MRSA by PCR: NEGATIVE

## 2018-04-30 LAB — LIPASE, BLOOD: Lipase: 29 U/L (ref 11–51)

## 2018-04-30 MED ORDER — NICOTINE 14 MG/24HR TD PT24
14.0000 mg | MEDICATED_PATCH | Freq: Every day | TRANSDERMAL | Status: DC | PRN
  Administered 2018-04-30: 14 mg via TRANSDERMAL
  Filled 2018-04-30: qty 1

## 2018-04-30 MED ORDER — PRAVASTATIN SODIUM 40 MG PO TABS
40.0000 mg | ORAL_TABLET | Freq: Every day | ORAL | Status: DC
  Administered 2018-05-01 – 2018-05-02 (×2): 40 mg via ORAL
  Filled 2018-04-30 (×3): qty 1

## 2018-04-30 MED ORDER — TRAZODONE HCL 50 MG PO TABS: 25.0000 mg | ORAL_TABLET | Freq: Every evening | ORAL | Status: DC | PRN

## 2018-04-30 MED ORDER — FENTANYL CITRATE (PF) 100 MCG/2ML IJ SOLN: 25.0000 ug | INTRAMUSCULAR | Status: AC | PRN

## 2018-04-30 MED ORDER — ASPIRIN EC 81 MG PO TBEC
81.0000 mg | DELAYED_RELEASE_TABLET | Freq: Every day | ORAL | Status: DC
  Administered 2018-05-01 – 2018-05-03 (×2): 81 mg via ORAL
  Filled 2018-04-30 (×2): qty 1

## 2018-04-30 MED ORDER — ACETAMINOPHEN 325 MG PO TABS
650.0000 mg | ORAL_TABLET | Freq: Four times a day (QID) | ORAL | Status: DC | PRN
  Administered 2018-05-02 – 2018-05-03 (×2): 650 mg via ORAL
  Filled 2018-04-30 (×2): qty 2

## 2018-04-30 MED ORDER — KETOROLAC TROMETHAMINE 15 MG/ML IJ SOLN
15.0000 mg | Freq: Four times a day (QID) | INTRAMUSCULAR | Status: AC | PRN
  Administered 2018-05-02: 15 mg via INTRAVENOUS
  Filled 2018-04-30: qty 1

## 2018-04-30 MED ORDER — SENNA 8.6 MG PO TABS: 1.0000 | ORAL_TABLET | Freq: Two times a day (BID) | ORAL | Status: DC | PRN

## 2018-04-30 MED ORDER — ALBUTEROL SULFATE (2.5 MG/3ML) 0.083% IN NEBU: 2.5000 mg | INHALATION_SOLUTION | Freq: Four times a day (QID) | RESPIRATORY_TRACT | Status: DC | PRN

## 2018-04-30 MED ORDER — ATENOLOL 25 MG PO TABS
50.0000 mg | ORAL_TABLET | Freq: Every day | ORAL | Status: DC
  Administered 2018-05-01 – 2018-05-03 (×2): 50 mg via ORAL
  Filled 2018-04-30 (×3): qty 2

## 2018-04-30 MED ORDER — ONDANSETRON HCL 4 MG PO TABS: 4.0000 mg | ORAL_TABLET | Freq: Four times a day (QID) | ORAL | Status: DC | PRN

## 2018-04-30 MED ORDER — SODIUM CHLORIDE 0.9% FLUSH: 3.0000 mL | INTRAVENOUS | Status: DC | PRN

## 2018-04-30 MED ORDER — ONDANSETRON HCL 4 MG/2ML IJ SOLN: 4.0000 mg | Freq: Four times a day (QID) | INTRAMUSCULAR | Status: DC | PRN

## 2018-04-30 MED ORDER — ENOXAPARIN SODIUM 40 MG/0.4ML ~~LOC~~ SOLN
40.0000 mg | SUBCUTANEOUS | Status: DC
  Administered 2018-04-30 – 2018-05-02 (×3): 40 mg via SUBCUTANEOUS
  Filled 2018-04-30 (×3): qty 0.4

## 2018-04-30 MED ORDER — ACETAMINOPHEN 650 MG RE SUPP: 650.0000 mg | Freq: Four times a day (QID) | RECTAL | Status: DC | PRN

## 2018-04-30 MED ORDER — TIOTROPIUM BROMIDE MONOHYDRATE 18 MCG IN CAPS
18.0000 ug | ORAL_CAPSULE | Freq: Every day | RESPIRATORY_TRACT | Status: DC
  Administered 2018-05-01: 18 ug via RESPIRATORY_TRACT
  Filled 2018-04-30: qty 5

## 2018-04-30 MED ORDER — DIAZEPAM 2 MG PO TABS: 2.0000 mg | ORAL_TABLET | Freq: Four times a day (QID) | ORAL | Status: DC | PRN

## 2018-04-30 MED ORDER — IOPAMIDOL (ISOVUE-370) INJECTION 76%
150.0000 mL | Freq: Once | INTRAVENOUS | Status: AC | PRN
  Administered 2018-04-30: 150 mL via INTRAVENOUS

## 2018-04-30 MED ORDER — SODIUM CHLORIDE 0.9% FLUSH
3.0000 mL | Freq: Two times a day (BID) | INTRAVENOUS | Status: DC
  Administered 2018-04-30 – 2018-05-03 (×6): 3 mL via INTRAVENOUS

## 2018-04-30 MED ORDER — SODIUM CHLORIDE 0.9 % IV SOLN: 250.0000 mL | INTRAVENOUS | Status: DC | PRN

## 2018-04-30 MED ORDER — FENTANYL CITRATE (PF) 100 MCG/2ML IJ SOLN
50.0000 ug | Freq: Once | INTRAMUSCULAR | Status: AC
  Administered 2018-04-30: 50 ug via INTRAVENOUS
  Filled 2018-04-30: qty 2

## 2018-04-30 NOTE — Progress Notes (Signed)
Patient removed 2 gold ball earrings from ears placed in ziplock bag with patient's name and room number. Patient gave bag to daughter that was in the ED room.

## 2018-04-30 NOTE — ED Provider Notes (Signed)
[facility] Provider Note   CSN: [id number] Arrival date & time: 04/30/18  6283     History   Chief Complaint Chief Complaint  Patient presents with  . Chest Pain    HPI Sarah Rich is a 72 y.o. female.  HPI Patient presents with central chest pain with this started this morning upon waking.  Describes the pain as sharp.  Pain now radiates through to her back.  She denies any shortness of breath.  Has a history of COPD and occasionally wears oxygen at night.  States she is now having pain that is moving up posterior thoracic back into her neck.  Denies any previously similar symptoms in the past.  Took aspirin prior to coming to the emergency department. Past Medical History:  Diagnosis Date  . Asthma   . COPD (chronic obstructive pulmonary disease) ([place])   . Hypercholesteremia   . Hypertension   . Kidney stones   . Shortness of breath     Patient Active Problem List   Diagnosis Date Noted  . Pneumothorax on right 04/30/2018  . COPD (chronic obstructive pulmonary disease) ([place]) 04/30/2018  . Acute respiratory distress 04/30/2018  . Shortness of breath 04/30/2018  . Atelectasis of right lung 04/30/2018  . Supplemental oxygen dependent 04/30/2018  . LLL Pulmonary nodule 04/30/2018  . Former smoker 04/30/2018  . Atypical chest pain 04/30/2018  . Pleuritic chest pain 04/30/2018  . Acute respiratory failure with hypoxemia ([place]) 10/24/2015  . Hypercholesteremia   . COPD exacerbation ([place]) 10/20/2015  . Hypertension 10/20/2015    Past Surgical History:  Procedure Laterality Date  . ABDOMINAL HYSTERECTOMY    . CHOLECYSTECTOMY    . COLONOSCOPY  05/28/2012   Procedure: COLONOSCOPY;  Surgeon: Danie Binder, MD;  Location: [facility];  Service: Endoscopy;  Laterality: N/A;  9:45 AM  . EXPLORATORY LAPAROTOMY    . right breast cystectomy  1980   benign     OB History   None      Home Medications    Prior to Admission medications     Medication Sig Start Date End Date Taking? Authorizing Provider  albuterol (PROVENTIL HFA;VENTOLIN HFA) 108 (90 BASE) MCG/ACT inhaler Inhale 2 puffs into the lungs every 6 (six) hours as needed. Shortness of Breath   Yes [provider]  albuterol (PROVENTIL) (2.5 MG/3ML) 0.083% nebulizer solution Take 2.5 mg by nebulization every 6 (six) hours as needed for wheezing or shortness of breath.   Yes [provider]  aspirin 81 MG tablet Take 81 mg by mouth daily.   Yes [provider]  atenolol (TENORMIN) 50 MG tablet Take 50 mg by mouth daily.   Yes [provider]  cholecalciferol (VITAMIN D) 1000 UNITS tablet Take 1,000 Units by mouth daily.   Yes [provider]  diazepam (VALIUM) 2 MG tablet Take 2 mg by mouth every 6 (six) hours as needed for anxiety.   Yes [provider]  potassium chloride (KLOR-CON) 20 MEQ packet Take 20 mEq by mouth once.   Yes [provider]  pravastatin (PRAVACHOL) 40 MG tablet Take 40 mg by mouth daily.   Yes [provider]  SPIRIVA HANDIHALER 18 MCG inhalation capsule Place 18 mcg into inhaler and inhale daily.  09/29/15  Yes [provider]  vitamin B-12 (CYANOCOBALAMIN) 1000 MCG tablet Take 1,000 mcg by mouth daily.   Yes [provider]  fluticasone (FLONASE) 50 MCG/ACT nasal spray Place 1-2 sprays into  both nostrils daily. 08/30/17   [provider]  furosemide (LASIX) 40 MG tablet Take 40 mg by mouth daily as needed. 02/18/18   [provider]  predniSONE (DELTASONE) 20 MG tablet Take 2 tablets (40 mg total) by mouth daily. 11/12/17   Noemi Chapel, MD    Family History Family History  Problem Relation Age of Onset  . Colon cancer Neg Hx     Social History Social History   Tobacco Use  . Smoking status: Former Smoker    Packs/day: 0.25    Years: 50.00    Pack years: 12.50    Types: Cigarettes  Substance Use Topics  . Alcohol use: Yes     Alcohol/week: 3.0 standard drinks    Types: 3 Standard drinks or equivalent per week  . Drug use: No     Allergies   Sulfa antibiotics; Codeine; and Levaquin [levofloxacin]   Review of Systems Review of Systems  Constitutional: Negative for chills and fever.  HENT: Negative for congestion, sinus pressure and sore throat.   Eyes: Negative for visual disturbance.  Respiratory: Negative for cough and shortness of breath.   Cardiovascular: Positive for chest pain. Negative for palpitations and leg swelling.  Gastrointestinal: Negative for abdominal pain, constipation, diarrhea, nausea and vomiting.  Genitourinary: Negative for dysuria, flank pain, frequency and hematuria.  Musculoskeletal: Positive for back pain, myalgias and neck pain. Negative for neck stiffness.  Skin: Negative for rash and wound.  Neurological: Positive for headaches. Negative for dizziness, weakness, light-headedness and numbness.     Physical Exam Updated Vital Signs BP 129/72   Pulse 64   Temp 97.6 F (36.4 C) (Oral)   Resp (!) 28   Ht 5\' 8"  (1.727 m)   Wt 59.4 kg   SpO2 97%   BMI 19.92 kg/m   Physical Exam  Constitutional: She is oriented to person, place, and time. She appears well-developed and well-nourished. No distress.  HENT:  Head: Normocephalic and atraumatic.  Mouth/Throat: Oropharynx is clear and moist. No oropharyngeal exudate.  Eyes: Pupils are equal, round, and reactive to light. EOM are normal.  Neck: Normal range of motion. Neck supple. No JVD present.  No meningismus.  Patient does have some posterior paraspinal tenderness to palpation especially on the right.  Mild right trapezius tenderness to palpation.  No midline tenderness to palpation.  Cardiovascular: Normal rate and regular rhythm. Exam reveals no gallop and no friction rub.  No murmur heard. Pulmonary/Chest: Effort normal and breath sounds normal. No stridor. No respiratory distress. She has no wheezes. She has no rales.  She exhibits no tenderness.  Abdominal: Soft. Bowel sounds are normal. There is no tenderness. There is no rebound and no guarding.  Musculoskeletal: Normal range of motion. She exhibits no edema or tenderness.  Patient with right sided thoracic paraspinal tenderness just medial to the right scapular border.  No midline thoracic or lumbar tenderness.  No lower extremity swelling, asymmetry or tenderness.  Question diminished radial pulse on the left compared to the right.  Bilateral lower extremity pulses intact and equal.  Lymphadenopathy:    She has no cervical adenopathy.  Neurological: She is alert and oriented to person, place, and time.  Moving all extremities without focal deficit.  Sensation fully intact.  Skin: Skin is warm and dry. Capillary refill takes less than 2 seconds. No rash noted. She is not diaphoretic. No erythema.  Psychiatric: Her behavior is normal.  Anxious appearing  Nursing note and vitals reviewed.  ED Treatments / Results  Labs (all labs ordered are listed, but only abnormal results are displayed) Labs Reviewed  COMPREHENSIVE METABOLIC PANEL - Abnormal; Notable for the following components:      Result Value   Total Bilirubin 1.4 (*)    All other components within normal limits  CBC  TROPONIN I  LIPASE, BLOOD    EKG EKG Interpretation  Date/Time:  Monday April 30 2018 09:35:06 EDT Ventricular Rate:  64 PR Interval:    QRS Duration: 99 QT Interval:  411 QTC Calculation: 424 R Axis:   -161 Text Interpretation:  Right and left arm electrode reversal, interpretation assumes no reversal Sinus or ectopic atrial rhythm Probable lateral infarct, age indeterminate Confirmed by Julianne Rice [phone]) on 04/30/2018 9:57:58 AM   Radiology Dg Chest 2 View  Result Date: 04/30/2018 CLINICAL DATA:  Chest pain. History of COPD. EXAM: CHEST - 2 VIEW COMPARISON:  11/12/2017 FINDINGS: The cardiomediastinal silhouette is unchanged with normal heart size.  Background lung hyperinflation is again noted consistent with the history of COPD. There is new near complete collapse of the right lower lobe. There is a small right pleural effusion. No pneumothorax is identified. No acute osseous abnormality is seen. IMPRESSION: New right lower lobe collapse with small right pleural effusion. Chest CT is pending. Electronically Signed   By: Logan Bores M.D.   On: 04/30/2018 10:54   Ct Angio Neck W And/or Wo Contrast  Result Date: 04/30/2018 CLINICAL DATA:  Chest and neck pain beginning today. EXAM: CT ANGIOGRAPHY  NECK TECHNIQUE: Multidetector CT imaging of the neck was performed using the standard protocol during bolus administration of intravenous contrast. Multiplanar CT image reconstructions and MIPs were obtained to evaluate the vascular anatomy. Carotid stenosis measurements (when applicable) are obtained utilizing NASCET criteria, using the distal internal carotid diameter as the denominator. CONTRAST:  134mL ISOVUE-370 IOPAMIDOL (ISOVUE-370) INJECTION 76% COMPARISON:  None. FINDINGS: CTA NECK FINDINGS Aortic arch: Aortic atherosclerosis. No evidence of dissection in the portion imaged. Branching pattern of the brachiocephalic vessels is normal. Innominate artery origin is not included. 30% stenosis of the left common carotid artery origin. 20% stenosis of the left subclavian origin. Right carotid system: Common carotid artery is widely patent to the bifurcation. There is calcified plaque at the carotid bifurcation and proximal ICA. Minimal diameter in the ICA bulb is 2.5 mm. Compared to a more distal cervical ICA diameter of 5 mm, this indicates a 50% stenosis. Left carotid system: Common carotid artery is widely patent beyond the origin to the bifurcation. There is calcified plaque at the carotid bifurcation and ICA bulb. No narrowing of the ICA beyond the diameter of the more distal cervical ICA, therefore no stenosis. Vertebral arteries: Right vertebral artery  origin is widely patent. The right vertebral artery is widely patent through the cervical region to the foramen magnum. There is calcified plaque at the left vertebral artery origin with stenosis estimated at 50%. Beyond that, the left vertebral artery is widely patent through the cervical region. Skeleton: Ordinary cervical spondylosis. Other neck: No soft tissue mass or lymphadenopathy. Upper chest: Emphysema.  Benign appearing scarring at the apices. Review of the MIP images confirms the above findings IMPRESSION: No dissection seen at the aortic arch. No dissection involving the brachiocephalic vasculature. Atherosclerotic disease at the right carotid bifurcation and ICA bulb. 50% stenosis in the ICA bulb. Mild atherosclerotic disease at the left carotid bifurcation. No stenosis. Atherosclerotic disease at the left vertebral artery origin with 50% stenosis. No  vertebral stenosis seen beyond that. Electronically Signed   By: Nelson Chimes M.D.   On: 04/30/2018 12:50   Ct Angio Chest Aorta W And/or Wo Contrast  Result Date: 04/30/2018 CLINICAL DATA:  Chest pain, complex. EXAM: CT ANGIOGRAPHY CHEST WITH CONTRAST TECHNIQUE: Multidetector CT imaging of the chest was performed using the standard protocol during bolus administration of intravenous contrast. Multiplanar CT image reconstructions and MIPs were obtained to evaluate the vascular anatomy. CONTRAST:  141mL ISOVUE-370 IOPAMIDOL (ISOVUE-370) INJECTION 76% COMPARISON:  04/19/2017 FINDINGS: Cardiovascular: Noncontrast phase was not acquired. Normal heart size. No pericardial effusion. There is patchy calcification on the coronary arteries and aorta. No aortic dissection or evidence of intramural hematoma. Three vessel branching with atherosclerotic plaque causing 60% stenosis at the left common carotid origin. Mediastinum/Nodes: No definite adenopathy. Lungs/Pleura: Small right pneumothorax. There is near complete collapse of the right middle and lower lobes.  There is a low-density appearance about the central bronchi which are narrowed. No discrete measurable mass lesion is seen at the hilum. Trace pleural fluid on the right that is likely sympathetic. No superimposed nodularity. 15 x 18 mm pulmonary nodule along the pleura of the left lower lobe and heart which is slowly enlarged since 2014. Airway thickening and questionable centrilobular emphysema. Septal thickening limited to the right apex, likely reactive. Upper Abdomen: Central hepatic cyst.  Left renal scarring. Musculoskeletal: Diffuse thoracic disc narrowing. Osteopenia. No acute osseous finding. Review of the MIP images confirms the above findings. IMPRESSION: 1. Right apical pneumothorax measuring less than 5%. 2. Right middle and lower lobe collapse. Indistinct low-density appearance along right lower lobe bronchi without discrete obstructive lesion. Recommend short follow-up CT or bronchoscopy. 3. 15 x 18 mm left lower lobe pulmonary nodule that has slowly grown since 2014. PET-CT was recommended 04/19/2017. 4. Aortic and coronary atherosclerosis without evidence of thoracic acute aortic syndrome. 5. 60% stenosis at the left common carotid origin. Electronically Signed   By: Monte Fantasia M.D.   On: 04/30/2018 12:56    Procedures Procedures (including critical care time)  Medications Ordered in ED Medications  fentaNYL (SUBLIMAZE) injection 50 mcg (50 mcg Intravenous Given 04/30/18 1003)  iopamidol (ISOVUE-370) 76 % injection 150 mL (150 mLs Intravenous Contrast Given 04/30/18 1149)     Initial Impression / Assessment and Plan / ED Course  I have reviewed the triage vital signs and the nursing notes.  Pertinent labs & imaging results that were available during my care of the patient were reviewed by me and considered in my medical decision making (see chart for details).    CT angio chest with evidence of small right-sided pneumothorax and collapse of the right middle and lower lobes of  the lung.  Discussed with Dr. Arnoldo Morale.  Recommends repeat chest x-ray tomorrow.  Given oxygen requirements, discussed with hospitalist will see patient in the emergency department.  No definite evidence of pneumonia.   Final Clinical Impressions(s) / ED Diagnoses   Final diagnoses:  Pneumothorax, unspecified type    ED Discharge Orders    None       Julianne Rice, MD 04/30/18 1447
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
## 2018-04-30 NOTE — ED Notes (Signed)
Call daughter in law/son if needed [phone]

## 2018-04-30 NOTE — ED Triage Notes (Signed)
Pt is having central chest pain that she describes is sharp and throbbing. Endorses SOB. Has taken 3 81mg  Aspirins. Has never had this pain before and radiates back to her spine.

## 2018-05-01 ENCOUNTER — Observation Stay (HOSPITAL_COMMUNITY): Payer: Medicare Other

## 2018-05-01 DIAGNOSIS — Z90710 Acquired absence of both cervix and uterus: Secondary | ICD-10-CM | POA: Diagnosis not present

## 2018-05-01 DIAGNOSIS — Z7952 Long term (current) use of systemic steroids: Secondary | ICD-10-CM | POA: Diagnosis not present

## 2018-05-01 DIAGNOSIS — J939 Pneumothorax, unspecified: Secondary | ICD-10-CM | POA: Diagnosis present

## 2018-05-01 DIAGNOSIS — Z9981 Dependence on supplemental oxygen: Secondary | ICD-10-CM | POA: Diagnosis not present

## 2018-05-01 DIAGNOSIS — R0781 Pleurodynia: Secondary | ICD-10-CM

## 2018-05-01 DIAGNOSIS — T17990A Other foreign object in respiratory tract, part unspecified in causing asphyxiation, initial encounter: Secondary | ICD-10-CM | POA: Diagnosis present

## 2018-05-01 DIAGNOSIS — J9383 Other pneumothorax: Secondary | ICD-10-CM | POA: Diagnosis present

## 2018-05-01 DIAGNOSIS — J449 Chronic obstructive pulmonary disease, unspecified: Secondary | ICD-10-CM | POA: Diagnosis present

## 2018-05-01 DIAGNOSIS — I10 Essential (primary) hypertension: Secondary | ICD-10-CM | POA: Diagnosis present

## 2018-05-01 DIAGNOSIS — Z79899 Other long term (current) drug therapy: Secondary | ICD-10-CM | POA: Diagnosis not present

## 2018-05-01 DIAGNOSIS — Z7982 Long term (current) use of aspirin: Secondary | ICD-10-CM | POA: Diagnosis not present

## 2018-05-01 DIAGNOSIS — F17200 Nicotine dependence, unspecified, uncomplicated: Secondary | ICD-10-CM

## 2018-05-01 DIAGNOSIS — J9621 Acute and chronic respiratory failure with hypoxia: Secondary | ICD-10-CM | POA: Diagnosis present

## 2018-05-01 DIAGNOSIS — R911 Solitary pulmonary nodule: Secondary | ICD-10-CM | POA: Diagnosis present

## 2018-05-01 DIAGNOSIS — E785 Hyperlipidemia, unspecified: Secondary | ICD-10-CM | POA: Diagnosis present

## 2018-05-01 DIAGNOSIS — J9611 Chronic respiratory failure with hypoxia: Secondary | ICD-10-CM | POA: Diagnosis present

## 2018-05-01 DIAGNOSIS — E7800 Pure hypercholesterolemia, unspecified: Secondary | ICD-10-CM | POA: Diagnosis present

## 2018-05-01 DIAGNOSIS — F411 Generalized anxiety disorder: Secondary | ICD-10-CM | POA: Diagnosis present

## 2018-05-01 DIAGNOSIS — X58XXXA Exposure to other specified factors, initial encounter: Secondary | ICD-10-CM | POA: Diagnosis present

## 2018-05-01 DIAGNOSIS — F17210 Nicotine dependence, cigarettes, uncomplicated: Secondary | ICD-10-CM | POA: Diagnosis present

## 2018-05-01 DIAGNOSIS — R0789 Other chest pain: Secondary | ICD-10-CM | POA: Diagnosis not present

## 2018-05-01 DIAGNOSIS — J9811 Atelectasis: Secondary | ICD-10-CM | POA: Diagnosis present

## 2018-05-01 DIAGNOSIS — R0603 Acute respiratory distress: Secondary | ICD-10-CM | POA: Diagnosis not present

## 2018-05-01 DIAGNOSIS — Z7951 Long term (current) use of inhaled steroids: Secondary | ICD-10-CM | POA: Diagnosis not present

## 2018-05-01 MED ORDER — ACETYLCYSTEINE 20 % IN SOLN
3.0000 mL | RESPIRATORY_TRACT | Status: DC
  Administered 2018-05-01 – 2018-05-02 (×7): 3 mL via RESPIRATORY_TRACT
  Filled 2018-05-01 (×8): qty 4

## 2018-05-01 MED ORDER — GUAIFENESIN ER 600 MG PO TB12
1200.0000 mg | ORAL_TABLET | Freq: Two times a day (BID) | ORAL | Status: DC
  Administered 2018-05-01 – 2018-05-03 (×4): 1200 mg via ORAL
  Filled 2018-05-01 (×4): qty 2

## 2018-05-01 MED ORDER — SODIUM CHLORIDE 0.9 % IV SOLN
500.0000 mg | INTRAVENOUS | Status: DC
  Administered 2018-05-01 – 2018-05-03 (×2): 500 mg via INTRAVENOUS
  Filled 2018-05-01 (×5): qty 500

## 2018-05-01 MED ORDER — CHLORHEXIDINE GLUCONATE CLOTH 2 % EX PADS
6.0000 | MEDICATED_PAD | Freq: Once | CUTANEOUS | Status: AC
  Administered 2018-05-01: 6 via TOPICAL

## 2018-05-01 MED ORDER — CHLORHEXIDINE GLUCONATE CLOTH 2 % EX PADS
6.0000 | MEDICATED_PAD | Freq: Once | CUTANEOUS | Status: AC
  Administered 2018-05-02: 6 via TOPICAL

## 2018-05-01 MED ORDER — METHYLPREDNISOLONE SODIUM SUCC 40 MG IJ SOLR
40.0000 mg | Freq: Two times a day (BID) | INTRAMUSCULAR | Status: DC
  Administered 2018-05-01 – 2018-05-03 (×4): 40 mg via INTRAVENOUS
  Filled 2018-05-01 (×4): qty 1

## 2018-05-01 MED ORDER — ALBUTEROL SULFATE (2.5 MG/3ML) 0.083% IN NEBU
2.5000 mg | INHALATION_SOLUTION | RESPIRATORY_TRACT | Status: DC
  Administered 2018-05-01 – 2018-05-02 (×7): 2.5 mg via RESPIRATORY_TRACT
  Filled 2018-05-01 (×8): qty 3

## 2018-05-01 MED ORDER — ENOXAPARIN SODIUM 40 MG/0.4ML ~~LOC~~ SOLN
40.0000 mg | Freq: Once | SUBCUTANEOUS | Status: AC
  Administered 2018-05-02: 40 mg via SUBCUTANEOUS
  Filled 2018-05-01: qty 0.4

## 2018-05-01 MED ORDER — HYDROXYZINE HCL 25 MG PO TABS
25.0000 mg | ORAL_TABLET | Freq: Three times a day (TID) | ORAL | Status: DC | PRN
  Administered 2018-05-01: 25 mg via ORAL
  Filled 2018-05-01: qty 1

## 2018-05-01 MED ORDER — SODIUM CHLORIDE 0.9 % IV SOLN
1.0000 g | INTRAVENOUS | Status: DC
  Administered 2018-05-01 – 2018-05-03 (×2): 1 g via INTRAVENOUS
  Filled 2018-05-01: qty 1
  Filled 2018-05-01: qty 10
  Filled 2018-05-01: qty 1
  Filled 2018-05-01 (×2): qty 10

## 2018-05-01 NOTE — Progress Notes (Signed)
PROGRESS NOTE   GERALDYNE BARRACLOUGH  BTD:[id number]  DOB: 1945/10/26  DOA: 04/30/2018 PCP: Lemmie Evens, MD   Brief Admission Hx: Sarah Rich is a 72 y.o. female long-time smoker with COPD on intermittent home oxygen presented to the emergency department complaining of central chest pain described as sharp stabbing throbbing pain associated with shortness of breath that has progressively worsened.  She was found to have a small apical pneumothorax and atelectasis of RML/RLL.    MDM/Assessment & Plan:   1. Right apical pneumothorax -patient is admitted to stepdown unit for observation of any acute decompensation.  Plan to repeat chest x-ray today and obtain pulmonary consultation.  Further recommendations to follow.  2. Atelectasis of the right middle and lower lobe-continue incentive spirometry every 1-2 hours while awake. 3. Left lower lobe pulmonary nodule-obtain pulmonary consultation, outpatient PET scan had been recommended in August 2018.  The nodule has appeared to have enlarged since that time.  The patient says that she has known about the nodule for a couple of years and she has been following it with her PCP Dr. Karie Kirks.  She is unaware if she has had a PET scan to look at this before.  4. Atypical/pleuritic chest pain-secondary to above, treating with ketorolac, Tylenol and fentanyl for severe pain.  Troponin has been negative.  EKG with no ischemic changes. 5. Tobacco  Use - She says that she is only a light smoker now and is not sure if she will have nicotine cravings but would request a PRN order for nicotine patch if needed.  Pt has been counseled to stop smoking.   6. COPD-resume home respiratory medications and follow clinically.  Appears stable at this time. 7. Acute respiratory distress with hypoxia-improved with supplemental oxygen which will be continued. 8. Essential hypertension-resume home medications and follow. 9. Generalized anxiety disorder-oral diazepam ordered  as needed for severe symptoms. 10. Hyperlipidemia-resume home medications.  DVT Prophylaxis: Lovenox Code Status: Full Family Communication: Patient Disposition Plan: Home when medically stabilized and consultant sign off  Consultants:  pulmonary  Subjective: Pt says that her SOB is improved today.    Objective: Vitals:   04/30/18 1613 04/30/18 2000 04/30/18 2024 05/01/18 0000  BP:  130/68  113/74  Pulse: 75 82  66  Resp: (!) 23 (!) 29  (!) 24  Temp: 98 F (36.7 C)  98.3 F (36.8 C)   TempSrc: Oral  Oral   SpO2: 94% 93%  96%  Weight:      Height:        Intake/Output Summary (Last 24 hours) at 05/01/2018 0604 Last data filed at 04/30/2018 1900 Gross per 24 hour  Intake 243 ml  Output -  Net 243 ml   Filed Weights   04/30/18 0929 04/30/18 1514  Weight: 59.4 kg 60.3 kg   REVIEW OF SYSTEMS  As per history otherwise all reviewed and reported negative  Exam:  General exam: awake, alert, NAD, cooperative.  Respiratory system: diminished BS RLL.  No increased work of breathing. Cardiovascular system: S1 & S2 heard, RRR. No JVD, murmurs, gallops, clicks or pedal edema. Gastrointestinal system: Abdomen is nondistended, soft and nontender. Normal bowel sounds heard. Central nervous system: Alert and oriented. No focal neurological deficits. Extremities: no CCE.  Data Reviewed: Basic Metabolic Panel: Recent Labs  Lab 04/30/18 0941  NA 140  K 3.9  CL 101  CO2 30  GLUCOSE 89  BUN 20  CREATININE 0.84  CALCIUM 9.1   Liver Function  Tests: Recent Labs  Lab 04/30/18 0941  AST 25  ALT 14  ALKPHOS 63  BILITOT 1.4*  PROT 7.0  ALBUMIN 3.9   Recent Labs  Lab 04/30/18 0941  LIPASE 29   No results for input(s): AMMONIA in the last 168 hours. CBC: Recent Labs  Lab 04/30/18 0941  WBC 5.8  HGB 13.2  HCT 40.9  MCV 92.1  PLT 233   Cardiac Enzymes: Recent Labs  Lab 04/30/18 0941  TROPONINI <0.03   CBG (last 3)  No results for input(s): GLUCAP in  the last 72 hours. Recent Results (from the past 240 hour(s))  MRSA PCR Screening     Status: None   Collection Time: 04/30/18  3:19 PM  Result Value Ref Range Status   MRSA by PCR NEGATIVE NEGATIVE Final    Comment:        The GeneXpert MRSA Assay (FDA approved for NASAL specimens only), is one component of a comprehensive MRSA colonization surveillance program. It is not intended to diagnose MRSA infection nor to guide or monitor treatment for MRSA infections. Performed at [facility], [address] [place], [place] [address]      Studies: Dg Chest 2 View  Result Date: 04/30/2018 CLINICAL DATA:  Chest pain. History of COPD. EXAM: CHEST - 2 VIEW COMPARISON:  11/12/2017 FINDINGS: The cardiomediastinal silhouette is unchanged with normal heart size. Background lung hyperinflation is again noted consistent with the history of COPD. There is new near complete collapse of the right lower lobe. There is a small right pleural effusion. No pneumothorax is identified. No acute osseous abnormality is seen. IMPRESSION: New right lower lobe collapse with small right pleural effusion. Chest CT is pending. Electronically Signed   By: Logan Bores M.D.   On: 04/30/2018 10:54   Ct Angio Neck W And/or Wo Contrast  Result Date: 04/30/2018 CLINICAL DATA:  Chest and neck pain beginning today. EXAM: CT ANGIOGRAPHY  NECK TECHNIQUE: Multidetector CT imaging of the neck was performed using the standard protocol during bolus administration of intravenous contrast. Multiplanar CT image reconstructions and MIPs were obtained to evaluate the vascular anatomy. Carotid stenosis measurements (when applicable) are obtained utilizing NASCET criteria, using the distal internal carotid diameter as the denominator. CONTRAST:  134mL ISOVUE-370 IOPAMIDOL (ISOVUE-370) INJECTION 76% COMPARISON:  None. FINDINGS: CTA NECK FINDINGS Aortic arch: Aortic atherosclerosis. No evidence of dissection in the portion imaged. Branching  pattern of the brachiocephalic vessels is normal. Innominate artery origin is not included. 30% stenosis of the left common carotid artery origin. 20% stenosis of the left subclavian origin. Right carotid system: Common carotid artery is widely patent to the bifurcation. There is calcified plaque at the carotid bifurcation and proximal ICA. Minimal diameter in the ICA bulb is 2.5 mm. Compared to a more distal cervical ICA diameter of 5 mm, this indicates a 50% stenosis. Left carotid system: Common carotid artery is widely patent beyond the origin to the bifurcation. There is calcified plaque at the carotid bifurcation and ICA bulb. No narrowing of the ICA beyond the diameter of the more distal cervical ICA, therefore no stenosis. Vertebral arteries: Right vertebral artery origin is widely patent. The right vertebral artery is widely patent through the cervical region to the foramen magnum. There is calcified plaque at the left vertebral artery origin with stenosis estimated at 50%. Beyond that, the left vertebral artery is widely patent through the cervical region. Skeleton: Ordinary cervical spondylosis. Other neck: No soft tissue mass or lymphadenopathy. Upper chest:  Emphysema.  Benign appearing scarring at the apices. Review of the MIP images confirms the above findings IMPRESSION: No dissection seen at the aortic arch. No dissection involving the brachiocephalic vasculature. Atherosclerotic disease at the right carotid bifurcation and ICA bulb. 50% stenosis in the ICA bulb. Mild atherosclerotic disease at the left carotid bifurcation. No stenosis. Atherosclerotic disease at the left vertebral artery origin with 50% stenosis. No vertebral stenosis seen beyond that. Electronically Signed   By: Nelson Chimes M.D.   On: 04/30/2018 12:50   Ct Angio Chest Aorta W And/or Wo Contrast  Result Date: 04/30/2018 CLINICAL DATA:  Chest pain, complex. EXAM: CT ANGIOGRAPHY CHEST WITH CONTRAST TECHNIQUE: Multidetector CT  imaging of the chest was performed using the standard protocol during bolus administration of intravenous contrast. Multiplanar CT image reconstructions and MIPs were obtained to evaluate the vascular anatomy. CONTRAST:  171mL ISOVUE-370 IOPAMIDOL (ISOVUE-370) INJECTION 76% COMPARISON:  04/19/2017 FINDINGS: Cardiovascular: Noncontrast phase was not acquired. Normal heart size. No pericardial effusion. There is patchy calcification on the coronary arteries and aorta. No aortic dissection or evidence of intramural hematoma. Three vessel branching with atherosclerotic plaque causing 60% stenosis at the left common carotid origin. Mediastinum/Nodes: No definite adenopathy. Lungs/Pleura: Small right pneumothorax. There is near complete collapse of the right middle and lower lobes. There is a low-density appearance about the central bronchi which are narrowed. No discrete measurable mass lesion is seen at the hilum. Trace pleural fluid on the right that is likely sympathetic. No superimposed nodularity. 15 x 18 mm pulmonary nodule along the pleura of the left lower lobe and heart which is slowly enlarged since 2014. Airway thickening and questionable centrilobular emphysema. Septal thickening limited to the right apex, likely reactive. Upper Abdomen: Central hepatic cyst.  Left renal scarring. Musculoskeletal: Diffuse thoracic disc narrowing. Osteopenia. No acute osseous finding. Review of the MIP images confirms the above findings. IMPRESSION: 1. Right apical pneumothorax measuring less than 5%. 2. Right middle and lower lobe collapse. Indistinct low-density appearance along right lower lobe bronchi without discrete obstructive lesion. Recommend short follow-up CT or bronchoscopy. 3. 15 x 18 mm left lower lobe pulmonary nodule that has slowly grown since 2014. PET-CT was recommended 04/19/2017. 4. Aortic and coronary atherosclerosis without evidence of thoracic acute aortic syndrome. 5. 60% stenosis at the left common  carotid origin. Electronically Signed   By: Monte Fantasia M.D.   On: 04/30/2018 12:56   Scheduled Meds: . aspirin EC  81 mg Oral Daily  . atenolol  50 mg Oral Daily  . enoxaparin (LOVENOX) injection  40 mg Subcutaneous Q24H  . pravastatin  40 mg Oral q1800  . sodium chloride flush  3 mL Intravenous Q12H  . tiotropium  18 mcg Inhalation Daily   Continuous Infusions: . sodium chloride      Principal Problem:   Pneumothorax on right Active Problems:   Hypertension   Hypercholesteremia   COPD (chronic obstructive pulmonary disease) (HCC)   Acute respiratory distress   Shortness of breath   Atelectasis of right lung   Supplemental oxygen dependent   LLL Pulmonary nodule   Atypical chest pain   Pleuritic chest pain   Current every day smoker   Critical Care Time spent: 32 mins  Irwin Brakeman, MD, FAAFP [facility] Pager [phone]  If 7PM-7AM, please contact night-coverage [email or web address] Password [email or web address] 05/01/2018, 6:04 AM    LOS: 0 days

## 2018-05-01 NOTE — Progress Notes (Signed)
Went in to check on pt. Ceftriaxone completed. Pt c/o of itchiness all over. No c/o of difficulty breathing or skin rash. Dr. Wynetta Emery notified. Hydroxyzine 25 mg tid prn ordered.

## 2018-05-01 NOTE — H&P (View-Only) (Signed)
Consult requested by: [facility] hospitalist, Dr. Wynetta Emery Consult requested for: Abnormal chest x-ray  HPI: This is a 72 year old who came to the emergency department because of increasing shortness of breath and chest pain.  She had chest x-ray and then CT.  CT shows that she has a small apical pneumothorax on the right probably the source of her chest pain.  Chest x-ray and CT showed that she has lobar collapse of the right lower lobe and to some extent of the right middle lobe.  She says she is been sick for about a month off and on with cough and congestion.  At baseline she has severe COPD.  She says she was hospitalized in the past and was told that she had mucous plugs.  She also has a pulmonary nodule that has slowly grown over the last several years.  She denies any precordial chest pain.  She is coughing up clear sputum.  She is been wheezing some.  She feels better this morning.  She is short of breath at minimal activity at baseline.  She has not had any nausea vomiting diarrhea.  No headache.  No definite fever.  Past Medical History:  Diagnosis Date  . Asthma   . COPD (chronic obstructive pulmonary disease) ([place])   . Hypercholesteremia   . Hypertension   . Kidney stones   . Shortness of breath      Family History  Problem Relation Age of Onset  . Colon cancer Neg Hx   No definite family history of COPD  Social History   Socioeconomic History  . Marital status: Widowed    Spouse name: Not on file  . Number of children: Not on file  . Years of education: Not on file  . Highest education level: Not on file  Occupational History  . Not on file  Social Needs  . Financial resource strain: Not on file  . Food insecurity:    Worry: Not on file    Inability: Not on file  . Transportation needs:    Medical: Not on file    Non-medical: Not on file  Tobacco Use  . Smoking status: Current Every Day Smoker    Packs/day: 0.25    Years: 50.00    Pack years: 12.50    Types:  Cigarettes  . Smokeless tobacco: Never Used  Substance and Sexual Activity  . Alcohol use: Yes    Alcohol/week: 3.0 standard drinks    Types: 3 Standard drinks or equivalent per week  . Drug use: No  . Sexual activity: Not Currently  Lifestyle  . Physical activity:    Days per week: Not on file    Minutes per session: Not on file  . Stress: Not on file  Relationships  . Social connections:    Talks on phone: Not on file    Gets together: Not on file    Attends religious service: Not on file    Active member of club or organization: Not on file    Attends meetings of clubs or organizations: Not on file    Relationship status: Not on file  Other Topics Concern  . Not on file  Social History Narrative  . Not on file     ROS: 10 point review of systems otherwise is negative    Objective: Vital signs in last 24 hours: Temp:  [97.3 F (36.3 C)-98.3 F (36.8 C)] 97.3 F (36.3 C) (08/27 0731) Pulse Rate:  [56-82] 64 (08/27 0700) Resp:  [17-29]  23 (08/27 0700) BP: (113-141)/(65-109) 113/74 (08/27 0000) SpO2:  [86 %-100 %] 95 % (08/27 0700) Weight:  [59.4 kg-61 kg] 61 kg (08/27 0700) Weight change:     Intake/Output from previous day: 08/26 0701 - 08/27 0700 In: 243 [P.O.:240; I.V.:3] Out: -   PHYSICAL EXAM Constitutional: She is a thin female in no acute distress.  Eyes: Pupils react EOMI.  Ears nose mouth and throat: Her mucous membranes are moist.  Tympanic membranes are intact.  Throat is clear.  Cardiovascular: Her heart is regular with normal heart sounds.  Respiratory: Her respiratory effort is minimally increased.  She has diminished breath sounds bilaterally.  Gastrointestinal: Her abdomen is soft without masses.  Musculoskeletal: Normal muscle strength upper and lower extremities bilaterally.  Neurological: No focal abnormalities.  Psychiatric: Normal mood and affect  Lab Results: Basic Metabolic Panel: Recent Labs    04/30/18 0941  NA 140  K 3.9  CL 101   CO2 30  GLUCOSE 89  BUN 20  CREATININE 0.84  CALCIUM 9.1   Liver Function Tests: Recent Labs    04/30/18 0941  AST 25  ALT 14  ALKPHOS 63  BILITOT 1.4*  PROT 7.0  ALBUMIN 3.9   Recent Labs    04/30/18 0941  LIPASE 29   No results for input(s): AMMONIA in the last 72 hours. CBC: Recent Labs    04/30/18 0941  WBC 5.8  HGB 13.2  HCT 40.9  MCV 92.1  PLT 233   Cardiac Enzymes: Recent Labs    04/30/18 0941  TROPONINI <0.03   BNP: No results for input(s): PROBNP in the last 72 hours. D-Dimer: No results for input(s): DDIMER in the last 72 hours. CBG: No results for input(s): GLUCAP in the last 72 hours. Hemoglobin A1C: No results for input(s): HGBA1C in the last 72 hours. Fasting Lipid Panel: No results for input(s): CHOL, HDL, LDLCALC, TRIG, CHOLHDL, LDLDIRECT in the last 72 hours. Thyroid Function Tests: No results for input(s): TSH, T4TOTAL, FREET4, T3FREE, THYROIDAB in the last 72 hours. Anemia Panel: No results for input(s): VITAMINB12, FOLATE, FERRITIN, TIBC, IRON, RETICCTPCT in the last 72 hours. Coagulation: No results for input(s): LABPROT, INR in the last 72 hours. Urine Drug Screen: Drugs of Abuse  No results found for: LABOPIA, COCAINSCRNUR, LABBENZ, AMPHETMU, THCU, LABBARB  Alcohol Level: No results for input(s): ETH in the last 72 hours. Urinalysis: No results for input(s): COLORURINE, LABSPEC, PHURINE, GLUCOSEU, HGBUR, BILIRUBINUR, KETONESUR, PROTEINUR, UROBILINOGEN, NITRITE, LEUKOCYTESUR in the last 72 hours.  Invalid input(s): APPERANCEUR Misc. Labs:   ABGS: No results for input(s): PHART, PO2ART, TCO2, HCO3 in the last 72 hours.  Invalid input(s): PCO2   MICROBIOLOGY: Recent Results (from the past 240 hour(s))  MRSA PCR Screening     Status: None   Collection Time: 04/30/18  3:19 PM  Result Value Ref Range Status   MRSA by PCR NEGATIVE NEGATIVE Final    Comment:        The GeneXpert MRSA Assay (FDA approved for NASAL  specimens only), is one component of a comprehensive MRSA colonization surveillance program. It is not intended to diagnose MRSA infection nor to guide or monitor treatment for MRSA infections. Performed at [facility], [address], [place], [place] [address]     Studies/Results: Dg Chest 2 View  Result Date: 04/30/2018 CLINICAL DATA:  Chest pain. History of COPD. EXAM: CHEST - 2 VIEW COMPARISON:  11/12/2017 FINDINGS: The cardiomediastinal silhouette is unchanged with normal heart size. Background lung  hyperinflation is again noted consistent with the history of COPD. There is new near complete collapse of the right lower lobe. There is a small right pleural effusion. No pneumothorax is identified. No acute osseous abnormality is seen. IMPRESSION: New right lower lobe collapse with small right pleural effusion. Chest CT is pending. Electronically Signed   By: Logan Bores M.D.   On: 04/30/2018 10:54   Ct Angio Neck W And/or Wo Contrast  Result Date: 04/30/2018 CLINICAL DATA:  Chest and neck pain beginning today. EXAM: CT ANGIOGRAPHY  NECK TECHNIQUE: Multidetector CT imaging of the neck was performed using the standard protocol during bolus administration of intravenous contrast. Multiplanar CT image reconstructions and MIPs were obtained to evaluate the vascular anatomy. Carotid stenosis measurements (when applicable) are obtained utilizing NASCET criteria, using the distal internal carotid diameter as the denominator. CONTRAST:  124mL ISOVUE-370 IOPAMIDOL (ISOVUE-370) INJECTION 76% COMPARISON:  None. FINDINGS: CTA NECK FINDINGS Aortic arch: Aortic atherosclerosis. No evidence of dissection in the portion imaged. Branching pattern of the brachiocephalic vessels is normal. Innominate artery origin is not included. 30% stenosis of the left common carotid artery origin. 20% stenosis of the left subclavian origin. Right carotid system: Common carotid artery is widely patent to the bifurcation.  There is calcified plaque at the carotid bifurcation and proximal ICA. Minimal diameter in the ICA bulb is 2.5 mm. Compared to a more distal cervical ICA diameter of 5 mm, this indicates a 50% stenosis. Left carotid system: Common carotid artery is widely patent beyond the origin to the bifurcation. There is calcified plaque at the carotid bifurcation and ICA bulb. No narrowing of the ICA beyond the diameter of the more distal cervical ICA, therefore no stenosis. Vertebral arteries: Right vertebral artery origin is widely patent. The right vertebral artery is widely patent through the cervical region to the foramen magnum. There is calcified plaque at the left vertebral artery origin with stenosis estimated at 50%. Beyond that, the left vertebral artery is widely patent through the cervical region. Skeleton: Ordinary cervical spondylosis. Other neck: No soft tissue mass or lymphadenopathy. Upper chest: Emphysema.  Benign appearing scarring at the apices. Review of the MIP images confirms the above findings IMPRESSION: No dissection seen at the aortic arch. No dissection involving the brachiocephalic vasculature. Atherosclerotic disease at the right carotid bifurcation and ICA bulb. 50% stenosis in the ICA bulb. Mild atherosclerotic disease at the left carotid bifurcation. No stenosis. Atherosclerotic disease at the left vertebral artery origin with 50% stenosis. No vertebral stenosis seen beyond that. Electronically Signed   By: Nelson Chimes M.D.   On: 04/30/2018 12:50   Ct Angio Chest Aorta W And/or Wo Contrast  Result Date: 04/30/2018 CLINICAL DATA:  Chest pain, complex. EXAM: CT ANGIOGRAPHY CHEST WITH CONTRAST TECHNIQUE: Multidetector CT imaging of the chest was performed using the standard protocol during bolus administration of intravenous contrast. Multiplanar CT image reconstructions and MIPs were obtained to evaluate the vascular anatomy. CONTRAST:  183mL ISOVUE-370 IOPAMIDOL (ISOVUE-370) INJECTION 76%  COMPARISON:  04/19/2017 FINDINGS: Cardiovascular: Noncontrast phase was not acquired. Normal heart size. No pericardial effusion. There is patchy calcification on the coronary arteries and aorta. No aortic dissection or evidence of intramural hematoma. Three vessel branching with atherosclerotic plaque causing 60% stenosis at the left common carotid origin. Mediastinum/Nodes: No definite adenopathy. Lungs/Pleura: Small right pneumothorax. There is near complete collapse of the right middle and lower lobes. There is a low-density appearance about the central bronchi which are narrowed. No discrete measurable mass  lesion is seen at the hilum. Trace pleural fluid on the right that is likely sympathetic. No superimposed nodularity. 15 x 18 mm pulmonary nodule along the pleura of the left lower lobe and heart which is slowly enlarged since 2014. Airway thickening and questionable centrilobular emphysema. Septal thickening limited to the right apex, likely reactive. Upper Abdomen: Central hepatic cyst.  Left renal scarring. Musculoskeletal: Diffuse thoracic disc narrowing. Osteopenia. No acute osseous finding. Review of the MIP images confirms the above findings. IMPRESSION: 1. Right apical pneumothorax measuring less than 5%. 2. Right middle and lower lobe collapse. Indistinct low-density appearance along right lower lobe bronchi without discrete obstructive lesion. Recommend short follow-up CT or bronchoscopy. 3. 15 x 18 mm left lower lobe pulmonary nodule that has slowly grown since 2014. PET-CT was recommended 04/19/2017. 4. Aortic and coronary atherosclerosis without evidence of thoracic acute aortic syndrome. 5. 60% stenosis at the left common carotid origin. Electronically Signed   By: Monte Fantasia M.D.   On: 04/30/2018 12:56    Medications:  Prior to Admission:  Medications Prior to Admission  Medication Sig Dispense Refill Last Dose  . albuterol (PROVENTIL HFA;VENTOLIN HFA) 108 (90 BASE) MCG/ACT inhaler  Inhale 2 puffs into the lungs every 6 (six) hours as needed. Shortness of Breath   04/30/2018 at Unknown time  . albuterol (PROVENTIL) (2.5 MG/3ML) 0.083% nebulizer solution Take 2.5 mg by nebulization every 6 (six) hours as needed for wheezing or shortness of breath.   04/29/2018 at Unknown time  . aspirin 81 MG tablet Take 81 mg by mouth daily.   04/30/2018 at Unknown time  . atenolol (TENORMIN) 50 MG tablet Take 50 mg by mouth daily.   04/30/2018 at 0730  . cholecalciferol (VITAMIN D) 1000 UNITS tablet Take 1,000 Units by mouth daily.   04/30/2018 at Unknown time  . diazepam (VALIUM) 2 MG tablet Take 2 mg by mouth every 6 (six) hours as needed for anxiety.   unknown  . potassium chloride (KLOR-CON) 20 MEQ packet Take 20 mEq by mouth once.   04/30/2018 at Unknown time  . pravastatin (PRAVACHOL) 40 MG tablet Take 40 mg by mouth daily.   04/30/2018 at Unknown time  . SPIRIVA HANDIHALER 18 MCG inhalation capsule Place 18 mcg into inhaler and inhale daily.    04/30/2018 at Unknown time  . vitamin B-12 (CYANOCOBALAMIN) 1000 MCG tablet Take 1,000 mcg by mouth daily.   04/30/2018 at Unknown time  . fluticasone (FLONASE) 50 MCG/ACT nasal spray Place 1-2 sprays into both nostrils daily.   11/12/2017 at Unknown time  . furosemide (LASIX) 40 MG tablet Take 40 mg by mouth daily as needed.  11 unknown  . predniSONE (DELTASONE) 20 MG tablet Take 2 tablets (40 mg total) by mouth daily. 10 tablet 0 unknown   Scheduled: . acetylcysteine  3 mL Nebulization Q4H  . albuterol  2.5 mg Nebulization Q4H  . aspirin EC  81 mg Oral Daily  . atenolol  50 mg Oral Daily  . enoxaparin (LOVENOX) injection  40 mg Subcutaneous Q24H  . guaiFENesin  1,200 mg Oral BID  . methylPREDNISolone (SOLU-MEDROL) injection  40 mg Intravenous Q12H  . pravastatin  40 mg Oral q1800  . sodium chloride flush  3 mL Intravenous Q12H  . tiotropium  18 mcg Inhalation Daily   Continuous: . sodium chloride    . azithromycin    . cefTRIAXone (ROCEPHIN)   IV     NID:POEUMP chloride, acetaminophen **OR** acetaminophen, albuterol, diazepam, fentaNYL (SUBLIMAZE)  injection, ketorolac, nicotine, ondansetron **OR** ondansetron (ZOFRAN) IV, senna, sodium chloride flush, traZODone  Assesment: She has a small pneumothorax based on CT.  This is not visible on chest x-ray.  Chest x-ray repeated today which I have personally reviewed still shows atelectasis of the right lung.  She has chest pain likely from the pneumothorax  She has COPD and ongoing tobacco abuse and is high risk for lung cancer  She has a pulmonary nodule that has slowly grown over the last several years. Principal Problem:   Pneumothorax on right Active Problems:   Hypertension   Hypercholesteremia   COPD (chronic obstructive pulmonary disease) ([facility])   Acute respiratory distress   Shortness of breath   Atelectasis of right lung   Supplemental oxygen dependent   LLL Pulmonary nodule   Atypical chest pain   Pleuritic chest pain   Current every day smoker    Plan: I think she needs bronchoscopy.  I can do this tomorrow morning.  In the meantime we will start her on antibiotics because of the potential that she has some sort of an obstructive pneumonia and the fact that she is been coughing and congested more for the last month.  Add Mucomyst.    LOS: 0 days   Jaqlyn Gruenhagen L 05/01/2018, 8:16 AM

## 2018-05-01 NOTE — Consult Note (Signed)
Consult requested by: [facility] hospitalist, Dr. Wynetta Emery Consult requested for: Abnormal chest x-ray  HPI: This is a 72 year old who came to the emergency department because of increasing shortness of breath and chest pain.  She had chest x-ray and then CT.  CT shows that she has a small apical pneumothorax on the right probably the source of her chest pain.  Chest x-ray and CT showed that she has lobar collapse of the right lower lobe and to some extent of the right middle lobe.  She says she is been sick for about a month off and on with cough and congestion.  At baseline she has severe COPD.  She says she was hospitalized in the past and was told that she had mucous plugs.  She also has a pulmonary nodule that has slowly grown over the last several years.  She denies any precordial chest pain.  She is coughing up clear sputum.  She is been wheezing some.  She feels better this morning.  She is short of breath at minimal activity at baseline.  She has not had any nausea vomiting diarrhea.  No headache.  No definite fever.  Past Medical History:  Diagnosis Date  . Asthma   . COPD (chronic obstructive pulmonary disease) ([place])   . Hypercholesteremia   . Hypertension   . Kidney stones   . Shortness of breath      Family History  Problem Relation Age of Onset  . Colon cancer Neg Hx   No definite family history of COPD  Social History   Socioeconomic History  . Marital status: Widowed    Spouse name: Not on file  . Number of children: Not on file  . Years of education: Not on file  . Highest education level: Not on file  Occupational History  . Not on file  Social Needs  . Financial resource strain: Not on file  . Food insecurity:    Worry: Not on file    Inability: Not on file  . Transportation needs:    Medical: Not on file    Non-medical: Not on file  Tobacco Use  . Smoking status: Current Every Day Smoker    Packs/day: 0.25    Years: 50.00    Pack years: 12.50    Types:  Cigarettes  . Smokeless tobacco: Never Used  Substance and Sexual Activity  . Alcohol use: Yes    Alcohol/week: 3.0 standard drinks    Types: 3 Standard drinks or equivalent per week  . Drug use: No  . Sexual activity: Not Currently  Lifestyle  . Physical activity:    Days per week: Not on file    Minutes per session: Not on file  . Stress: Not on file  Relationships  . Social connections:    Talks on phone: Not on file    Gets together: Not on file    Attends religious service: Not on file    Active member of club or organization: Not on file    Attends meetings of clubs or organizations: Not on file    Relationship status: Not on file  Other Topics Concern  . Not on file  Social History Narrative  . Not on file     ROS: 10 point review of systems otherwise is negative    Objective: Vital signs in last 24 hours: Temp:  [97.3 F (36.3 C)-98.3 F (36.8 C)] 97.3 F (36.3 C) (08/27 0731) Pulse Rate:  [56-82] 64 (08/27 0700) Resp:  [17-29]  23 (08/27 0700) BP: (113-141)/(65-109) 113/74 (08/27 0000) SpO2:  [86 %-100 %] 95 % (08/27 0700) Weight:  [59.4 kg-61 kg] 61 kg (08/27 0700) Weight change:     Intake/Output from previous day: 08/26 0701 - 08/27 0700 In: 243 [P.O.:240; I.V.:3] Out: -   PHYSICAL EXAM Constitutional: She is a thin female in no acute distress.  Eyes: Pupils react EOMI.  Ears nose mouth and throat: Her mucous membranes are moist.  Tympanic membranes are intact.  Throat is clear.  Cardiovascular: Her heart is regular with normal heart sounds.  Respiratory: Her respiratory effort is minimally increased.  She has diminished breath sounds bilaterally.  Gastrointestinal: Her abdomen is soft without masses.  Musculoskeletal: Normal muscle strength upper and lower extremities bilaterally.  Neurological: No focal abnormalities.  Psychiatric: Normal mood and affect  Lab Results: Basic Metabolic Panel: Recent Labs    04/30/18 0941  NA 140  K 3.9  CL 101   CO2 30  GLUCOSE 89  BUN 20  CREATININE 0.84  CALCIUM 9.1   Liver Function Tests: Recent Labs    04/30/18 0941  AST 25  ALT 14  ALKPHOS 63  BILITOT 1.4*  PROT 7.0  ALBUMIN 3.9   Recent Labs    04/30/18 0941  LIPASE 29   No results for input(s): AMMONIA in the last 72 hours. CBC: Recent Labs    04/30/18 0941  WBC 5.8  HGB 13.2  HCT 40.9  MCV 92.1  PLT 233   Cardiac Enzymes: Recent Labs    04/30/18 0941  TROPONINI <0.03   BNP: No results for input(s): PROBNP in the last 72 hours. D-Dimer: No results for input(s): DDIMER in the last 72 hours. CBG: No results for input(s): GLUCAP in the last 72 hours. Hemoglobin A1C: No results for input(s): HGBA1C in the last 72 hours. Fasting Lipid Panel: No results for input(s): CHOL, HDL, LDLCALC, TRIG, CHOLHDL, LDLDIRECT in the last 72 hours. Thyroid Function Tests: No results for input(s): TSH, T4TOTAL, FREET4, T3FREE, THYROIDAB in the last 72 hours. Anemia Panel: No results for input(s): VITAMINB12, FOLATE, FERRITIN, TIBC, IRON, RETICCTPCT in the last 72 hours. Coagulation: No results for input(s): LABPROT, INR in the last 72 hours. Urine Drug Screen: Drugs of Abuse  No results found for: LABOPIA, COCAINSCRNUR, LABBENZ, AMPHETMU, THCU, LABBARB  Alcohol Level: No results for input(s): ETH in the last 72 hours. Urinalysis: No results for input(s): COLORURINE, LABSPEC, PHURINE, GLUCOSEU, HGBUR, BILIRUBINUR, KETONESUR, PROTEINUR, UROBILINOGEN, NITRITE, LEUKOCYTESUR in the last 72 hours.  Invalid input(s): APPERANCEUR Misc. Labs:   ABGS: No results for input(s): PHART, PO2ART, TCO2, HCO3 in the last 72 hours.  Invalid input(s): PCO2   MICROBIOLOGY: Recent Results (from the past 240 hour(s))  MRSA PCR Screening     Status: None   Collection Time: 04/30/18  3:19 PM  Result Value Ref Range Status   MRSA by PCR NEGATIVE NEGATIVE Final    Comment:        The GeneXpert MRSA Assay (FDA approved for NASAL  specimens only), is one component of a comprehensive MRSA colonization surveillance program. It is not intended to diagnose MRSA infection nor to guide or monitor treatment for MRSA infections. Performed at [facility], [address], [place], [place] [address]     Studies/Results: Dg Chest 2 View  Result Date: 04/30/2018 CLINICAL DATA:  Chest pain. History of COPD. EXAM: CHEST - 2 VIEW COMPARISON:  11/12/2017 FINDINGS: The cardiomediastinal silhouette is unchanged with normal heart size. Background lung  hyperinflation is again noted consistent with the history of COPD. There is new near complete collapse of the right lower lobe. There is a small right pleural effusion. No pneumothorax is identified. No acute osseous abnormality is seen. IMPRESSION: New right lower lobe collapse with small right pleural effusion. Chest CT is pending. Electronically Signed   By: Logan Bores M.D.   On: 04/30/2018 10:54   Ct Angio Neck W And/or Wo Contrast  Result Date: 04/30/2018 CLINICAL DATA:  Chest and neck pain beginning today. EXAM: CT ANGIOGRAPHY  NECK TECHNIQUE: Multidetector CT imaging of the neck was performed using the standard protocol during bolus administration of intravenous contrast. Multiplanar CT image reconstructions and MIPs were obtained to evaluate the vascular anatomy. Carotid stenosis measurements (when applicable) are obtained utilizing NASCET criteria, using the distal internal carotid diameter as the denominator. CONTRAST:  193mL ISOVUE-370 IOPAMIDOL (ISOVUE-370) INJECTION 76% COMPARISON:  None. FINDINGS: CTA NECK FINDINGS Aortic arch: Aortic atherosclerosis. No evidence of dissection in the portion imaged. Branching pattern of the brachiocephalic vessels is normal. Innominate artery origin is not included. 30% stenosis of the left common carotid artery origin. 20% stenosis of the left subclavian origin. Right carotid system: Common carotid artery is widely patent to the bifurcation.  There is calcified plaque at the carotid bifurcation and proximal ICA. Minimal diameter in the ICA bulb is 2.5 mm. Compared to a more distal cervical ICA diameter of 5 mm, this indicates a 50% stenosis. Left carotid system: Common carotid artery is widely patent beyond the origin to the bifurcation. There is calcified plaque at the carotid bifurcation and ICA bulb. No narrowing of the ICA beyond the diameter of the more distal cervical ICA, therefore no stenosis. Vertebral arteries: Right vertebral artery origin is widely patent. The right vertebral artery is widely patent through the cervical region to the foramen magnum. There is calcified plaque at the left vertebral artery origin with stenosis estimated at 50%. Beyond that, the left vertebral artery is widely patent through the cervical region. Skeleton: Ordinary cervical spondylosis. Other neck: No soft tissue mass or lymphadenopathy. Upper chest: Emphysema.  Benign appearing scarring at the apices. Review of the MIP images confirms the above findings IMPRESSION: No dissection seen at the aortic arch. No dissection involving the brachiocephalic vasculature. Atherosclerotic disease at the right carotid bifurcation and ICA bulb. 50% stenosis in the ICA bulb. Mild atherosclerotic disease at the left carotid bifurcation. No stenosis. Atherosclerotic disease at the left vertebral artery origin with 50% stenosis. No vertebral stenosis seen beyond that. Electronically Signed   By: Nelson Chimes M.D.   On: 04/30/2018 12:50   Ct Angio Chest Aorta W And/or Wo Contrast  Result Date: 04/30/2018 CLINICAL DATA:  Chest pain, complex. EXAM: CT ANGIOGRAPHY CHEST WITH CONTRAST TECHNIQUE: Multidetector CT imaging of the chest was performed using the standard protocol during bolus administration of intravenous contrast. Multiplanar CT image reconstructions and MIPs were obtained to evaluate the vascular anatomy. CONTRAST:  129mL ISOVUE-370 IOPAMIDOL (ISOVUE-370) INJECTION 76%  COMPARISON:  04/19/2017 FINDINGS: Cardiovascular: Noncontrast phase was not acquired. Normal heart size. No pericardial effusion. There is patchy calcification on the coronary arteries and aorta. No aortic dissection or evidence of intramural hematoma. Three vessel branching with atherosclerotic plaque causing 60% stenosis at the left common carotid origin. Mediastinum/Nodes: No definite adenopathy. Lungs/Pleura: Small right pneumothorax. There is near complete collapse of the right middle and lower lobes. There is a low-density appearance about the central bronchi which are narrowed. No discrete measurable mass  lesion is seen at the hilum. Trace pleural fluid on the right that is likely sympathetic. No superimposed nodularity. 15 x 18 mm pulmonary nodule along the pleura of the left lower lobe and heart which is slowly enlarged since 2014. Airway thickening and questionable centrilobular emphysema. Septal thickening limited to the right apex, likely reactive. Upper Abdomen: Central hepatic cyst.  Left renal scarring. Musculoskeletal: Diffuse thoracic disc narrowing. Osteopenia. No acute osseous finding. Review of the MIP images confirms the above findings. IMPRESSION: 1. Right apical pneumothorax measuring less than 5%. 2. Right middle and lower lobe collapse. Indistinct low-density appearance along right lower lobe bronchi without discrete obstructive lesion. Recommend short follow-up CT or bronchoscopy. 3. 15 x 18 mm left lower lobe pulmonary nodule that has slowly grown since 2014. PET-CT was recommended 04/19/2017. 4. Aortic and coronary atherosclerosis without evidence of thoracic acute aortic syndrome. 5. 60% stenosis at the left common carotid origin. Electronically Signed   By: Monte Fantasia M.D.   On: 04/30/2018 12:56    Medications:  Prior to Admission:  Medications Prior to Admission  Medication Sig Dispense Refill Last Dose  . albuterol (PROVENTIL HFA;VENTOLIN HFA) 108 (90 BASE) MCG/ACT inhaler  Inhale 2 puffs into the lungs every 6 (six) hours as needed. Shortness of Breath   04/30/2018 at Unknown time  . albuterol (PROVENTIL) (2.5 MG/3ML) 0.083% nebulizer solution Take 2.5 mg by nebulization every 6 (six) hours as needed for wheezing or shortness of breath.   04/29/2018 at Unknown time  . aspirin 81 MG tablet Take 81 mg by mouth daily.   04/30/2018 at Unknown time  . atenolol (TENORMIN) 50 MG tablet Take 50 mg by mouth daily.   04/30/2018 at 0730  . cholecalciferol (VITAMIN D) 1000 UNITS tablet Take 1,000 Units by mouth daily.   04/30/2018 at Unknown time  . diazepam (VALIUM) 2 MG tablet Take 2 mg by mouth every 6 (six) hours as needed for anxiety.   unknown  . potassium chloride (KLOR-CON) 20 MEQ packet Take 20 mEq by mouth once.   04/30/2018 at Unknown time  . pravastatin (PRAVACHOL) 40 MG tablet Take 40 mg by mouth daily.   04/30/2018 at Unknown time  . SPIRIVA HANDIHALER 18 MCG inhalation capsule Place 18 mcg into inhaler and inhale daily.    04/30/2018 at Unknown time  . vitamin B-12 (CYANOCOBALAMIN) 1000 MCG tablet Take 1,000 mcg by mouth daily.   04/30/2018 at Unknown time  . fluticasone (FLONASE) 50 MCG/ACT nasal spray Place 1-2 sprays into both nostrils daily.   11/12/2017 at Unknown time  . furosemide (LASIX) 40 MG tablet Take 40 mg by mouth daily as needed.  11 unknown  . predniSONE (DELTASONE) 20 MG tablet Take 2 tablets (40 mg total) by mouth daily. 10 tablet 0 unknown   Scheduled: . acetylcysteine  3 mL Nebulization Q4H  . albuterol  2.5 mg Nebulization Q4H  . aspirin EC  81 mg Oral Daily  . atenolol  50 mg Oral Daily  . enoxaparin (LOVENOX) injection  40 mg Subcutaneous Q24H  . guaiFENesin  1,200 mg Oral BID  . methylPREDNISolone (SOLU-MEDROL) injection  40 mg Intravenous Q12H  . pravastatin  40 mg Oral q1800  . sodium chloride flush  3 mL Intravenous Q12H  . tiotropium  18 mcg Inhalation Daily   Continuous: . sodium chloride    . azithromycin    . cefTRIAXone (ROCEPHIN)   IV     TDD:UKGURK chloride, acetaminophen **OR** acetaminophen, albuterol, diazepam, fentaNYL (SUBLIMAZE)  injection, ketorolac, nicotine, ondansetron **OR** ondansetron (ZOFRAN) IV, senna, sodium chloride flush, traZODone  Assesment: She has a small pneumothorax based on CT.  This is not visible on chest x-ray.  Chest x-ray repeated today which I have personally reviewed still shows atelectasis of the right lung.  She has chest pain likely from the pneumothorax  She has COPD and ongoing tobacco abuse and is high risk for lung cancer  She has a pulmonary nodule that has slowly grown over the last several years. Principal Problem:   Pneumothorax on right Active Problems:   Hypertension   Hypercholesteremia   COPD (chronic obstructive pulmonary disease) ([facility])   Acute respiratory distress   Shortness of breath   Atelectasis of right lung   Supplemental oxygen dependent   LLL Pulmonary nodule   Atypical chest pain   Pleuritic chest pain   Current every day smoker    Plan: I think she needs bronchoscopy.  I can do this tomorrow morning.  In the meantime we will start her on antibiotics because of the potential that she has some sort of an obstructive pneumonia and the fact that she is been coughing and congested more for the last month.  Add Mucomyst.    LOS: 0 days   Emilo Gras L 05/01/2018, 8:16 AM

## 2018-05-02 ENCOUNTER — Encounter (HOSPITAL_COMMUNITY)
Admission: EM | Disposition: A | Discharge status: Home / Self Care | Payer: Self-pay | Source: Home / Self Care | Attending: Family Medicine

## 2018-05-02 ENCOUNTER — Inpatient Hospital Stay (HOSPITAL_COMMUNITY): Payer: Medicare Other | Admitting: Anesthesiology

## 2018-05-02 ENCOUNTER — Encounter (HOSPITAL_COMMUNITY): Payer: Self-pay | Admitting: *Deleted

## 2018-05-02 DIAGNOSIS — I10 Essential (primary) hypertension: Secondary | ICD-10-CM

## 2018-05-02 HISTORY — PX: FLEXIBLE BRONCHOSCOPY: SHX5094

## 2018-05-02 HISTORY — PX: BRONCHIAL WASHINGS: SHX5105

## 2018-05-02 SURGERY — BRONCHOSCOPY, FLEXIBLE
Anesthesia: Monitor Anesthesia Care

## 2018-05-02 SURGERY — BRONCHOSCOPY, FLEXIBLE
Anesthesia: Monitor Anesthesia Care | Laterality: Bilateral

## 2018-05-02 MED ORDER — SODIUM CHLORIDE 0.9% FLUSH
INTRAVENOUS | Status: AC
  Filled 2018-05-02: qty 10

## 2018-05-02 MED ORDER — PROPOFOL 10 MG/ML IV BOLUS
INTRAVENOUS | Status: AC
  Filled 2018-05-02: qty 20

## 2018-05-02 MED ORDER — LACTATED RINGERS IV SOLN
INTRAVENOUS | Status: DC
  Administered 2018-05-02: 08:00:00 via INTRAVENOUS

## 2018-05-02 MED ORDER — IPRATROPIUM-ALBUTEROL 0.5-2.5 (3) MG/3ML IN SOLN
3.0000 mL | RESPIRATORY_TRACT | Status: DC
  Administered 2018-05-02: 3 mL via RESPIRATORY_TRACT

## 2018-05-02 MED ORDER — LIDOCAINE HCL (PF) 1 % IJ SOLN
INTRAMUSCULAR | Status: AC
  Filled 2018-05-02: qty 2

## 2018-05-02 MED ORDER — LIDOCAINE HCL (PF) 2 % IJ SOLN
INTRAMUSCULAR | Status: AC
  Filled 2018-05-02: qty 10

## 2018-05-02 MED ORDER — ALBUTEROL SULFATE (2.5 MG/3ML) 0.083% IN NEBU
2.5000 mg | INHALATION_SOLUTION | Freq: Four times a day (QID) | RESPIRATORY_TRACT | Status: DC
  Administered 2018-05-03 (×3): 2.5 mg via RESPIRATORY_TRACT
  Filled 2018-05-02 (×3): qty 3

## 2018-05-02 MED ORDER — DEXTROMETHORPHAN POLISTIREX ER 30 MG/5ML PO SUER
60.0000 mg | Freq: Two times a day (BID) | ORAL | Status: DC
  Administered 2018-05-02 – 2018-05-03 (×2): 60 mg via ORAL
  Filled 2018-05-02 (×2): qty 10

## 2018-05-02 MED ORDER — ACETYLCYSTEINE 20 % IN SOLN
3.0000 mL | Freq: Four times a day (QID) | RESPIRATORY_TRACT | Status: DC
  Administered 2018-05-03 (×3): 3 mL via RESPIRATORY_TRACT
  Filled 2018-05-02 (×3): qty 4

## 2018-05-02 MED ORDER — ALBUTEROL SULFATE (2.5 MG/3ML) 0.083% IN NEBU: 2.5000 mg | INHALATION_SOLUTION | RESPIRATORY_TRACT | Status: DC | PRN

## 2018-05-02 MED ORDER — LIDOCAINE VISCOUS HCL 2 % MT SOLN
OROMUCOSAL | Status: DC | PRN
  Administered 2018-05-02: 10 mL via OROMUCOSAL

## 2018-05-02 MED ORDER — LIDOCAINE HCL (PF) 1 % IJ SOLN
INTRAMUSCULAR | Status: AC
  Filled 2018-05-02: qty 30

## 2018-05-02 MED ORDER — LIDOCAINE VISCOUS HCL 2 % MT SOLN
OROMUCOSAL | Status: AC
  Filled 2018-05-02: qty 15

## 2018-05-02 MED ORDER — MIDAZOLAM HCL 2 MG/2ML IJ SOLN
INTRAMUSCULAR | Status: AC
  Filled 2018-05-02: qty 2

## 2018-05-02 MED ORDER — PROPOFOL 500 MG/50ML IV EMUL
INTRAVENOUS | Status: DC | PRN
  Administered 2018-05-02: 50 ug/kg/min via INTRAVENOUS

## 2018-05-02 MED ORDER — IPRATROPIUM-ALBUTEROL 0.5-2.5 (3) MG/3ML IN SOLN
RESPIRATORY_TRACT | Status: AC
  Filled 2018-05-02: qty 3

## 2018-05-02 MED ORDER — LIDOCAINE HCL (PF) 2 % IJ SOLN
INTRAMUSCULAR | Status: DC | PRN
  Administered 2018-05-02: 20 mL

## 2018-05-02 MED ORDER — MIDAZOLAM HCL 5 MG/5ML IJ SOLN
INTRAMUSCULAR | Status: DC | PRN
  Administered 2018-05-02: 2 mg via INTRAVENOUS

## 2018-05-02 SURGICAL SUPPLY — 16 items
BRUSH CYTOL CELLEBRITY 1.5X140 (MISCELLANEOUS) ×4 IMPLANT
CLOTH BEACON ORANGE TIMEOUT ST (SAFETY) ×4 IMPLANT
CONNECTOR 5 IN 1 STRAIGHT STRL (MISCELLANEOUS) ×4 IMPLANT
FORCEPS BIOP RJ4 1.8 (CUTTING FORCEPS) ×4 IMPLANT
GLOVE BIO SURGEON STRL SZ7.5 (GLOVE) ×4 IMPLANT
KIT CLEAN CATCH URINE (SET/KITS/TRAYS/PACK) IMPLANT
MARKER SKIN DUAL TIP RULER LAB (MISCELLANEOUS) ×4 IMPLANT
NS IRRIG 1000ML POUR BTL (IV SOLUTION) ×4 IMPLANT
SPONGE GAUZE 4X4 12PLY (GAUZE/BANDAGES/DRESSINGS) ×4 IMPLANT
SYR 20CC LL (SYRINGE) ×4 IMPLANT
SYR 30ML LL (SYRINGE) ×4 IMPLANT
SYR CONTROL 10ML LL (SYRINGE) ×4 IMPLANT
TRAP SPECIMEN CP (MISCELLANEOUS) ×4 IMPLANT
VALVE DISPOSABLE (MISCELLANEOUS) ×4 IMPLANT
WATER STERILE IRR 1000ML POUR (IV SOLUTION) ×4 IMPLANT
YANKAUER SUCT BULB TIP 10FT TU (MISCELLANEOUS) ×8 IMPLANT

## 2018-05-02 NOTE — Progress Notes (Signed)
Dr Hilaria Ota instructed for pt to be given 5 ml of  2% lidocaine nebulizer to be given in preoop. Lidocaine nebulizer tx given. Tolerated well.

## 2018-05-02 NOTE — Op Note (Signed)
Bronchoscopy Procedure Note Sarah Rich [id number] 08-Aug-1946  Procedure: Bronchoscopy Indications: Diagnostic evaluation of the airways, Obtain specimens for culture and/or other diagnostic studies and Remove secretions  Procedure Details Consent: Risks of procedure as well as the alternatives and risks of each were explained to the (patient/caregiver).  Consent for procedure obtained. Time Out: Verified patient identification, verified procedure, site/side was marked, verified correct patient position, special equipment/implants available, medications/allergies/relevent history reviewed, required imaging and test results available.  Performed  In preparation for procedure, patient was given 100% FiO2. Sedation: Propofol  Airway entered and the following bronchi were examined: RUL, RML, RLL, LUL, LLL and Bronchi.   Procedures performed: Suctioning of extensive mucus in right lower lobe and right middle lobe Bronchoscope removed.    Evaluation Hemodynamic Status: BP stable throughout; O2 sats: Fell during procedure which precipitated termination of the procedure Patient's Current Condition: stable Specimens:  Sent serosanguinous fluid Complications: Complications of Hypoxia likely from suctioning Patient did tolerate procedure well.  This bronchoscope was entered through the left naris.  Vocal cords were then divided and passed.  Tracheal mucosa was normal.  Left upper lobe and left lower lobe were examined and were normal.  Right lung was then examined and right upper lobe was normal but right middle and right lower lobe were obscured by large amount of mucus.  Suctioning of the mucus was done but it was very thick and difficult to pass.  She developed hypoxia likely from suctioning and ventilation perfusion mismatch with opening of the airways so procedure was terminated.  She overall tolerated well and did not have any symptoms.   Sarah Rich L 05/02/2018

## 2018-05-02 NOTE — Interval H&P Note (Signed)
History and Physical Interval Note:  05/02/2018 8:08 AM  Sarah Rich  has presented today for surgery, with the diagnosis of Atelectasis right lung  The various methods of treatment have been discussed with the patient and family. After consideration of risks, benefits and other options for treatment, the patient has consented to  Procedure(s): FLEXIBLE BRONCHOSCOPY (Bilateral) as a surgical intervention .  The patient's history has been reviewed, patient examined, no change in status, stable for surgery.  I have reviewed the patient's chart and labs.  Questions were answered to the patient's satisfaction.     Mical Kicklighter L

## 2018-05-02 NOTE — Progress Notes (Signed)
Patient having dry, hacking cough this afternoon. Notified MD and requested cough medication if possible. Orders placed by Dr. Wynetta Emery. Donavan Foil, RN

## 2018-05-02 NOTE — Transfer of Care (Signed)
Immediate Anesthesia Transfer of Care Note  Patient: Sarah Rich  Procedure(s) Performed: FLEXIBLE BRONCHOSCOPY (Bilateral )  Patient Location: PACU  Anesthesia Type:MAC  Level of Consciousness: awake and oriented  Airway & Oxygen Therapy: Patient Spontanous Breathing and Patient connected to face mask oxygen  Post-op Assessment: Report given to RN and Post -op Vital signs reviewed and stable  Post vital signs: Reviewed and stable  Last Vitals:  Vitals Value Taken Time  BP    Temp    Pulse 25 05/02/2018  8:50 AM  Resp    SpO2 81 % 05/02/2018  8:50 AM  Vitals shown include unvalidated device data.  Last Pain:  Vitals:   05/02/18 0724  TempSrc: Oral  PainSc: 6       Patients Stated Pain Goal: 0 (98/42/10 3128)  Complications: No apparent anesthesia complications

## 2018-05-02 NOTE — Progress Notes (Signed)
PROGRESS NOTE   Sarah Rich  DVV:[id number]  DOB: [birth year]  DOA: 04/30/2018 PCP: Lemmie Evens, MD   Brief Admission Hx: Sarah Rich is a 72 y.o. female long-time smoker with COPD on intermittent home oxygen presented to the emergency department complaining of central chest pain described as sharp stabbing throbbing pain associated with shortness of breath that has progressively worsened.  She was found to have a small apical pneumothorax and atelectasis of RML/RLL.    MDM/Assessment & Plan:   1. Right apical pneumothorax -patient is admitted to stepdown unit for observation of any acute decompensation.  Plan to repeat chest x-ray today and obtain pulmonary consultation.  Further recommendations to follow.  2. Atelectasis of the right middle and lower lobe-continue incentive spirometry every 1-2 hours while awake. Bronchoscopy today with Dr. Luan Pulling.  3. Left lower lobe pulmonary nodule-obtain pulmonary consultation, outpatient PET scan had been recommended in August 2018.  The nodule has appeared to have enlarged since that time.  The patient says that she has known about the nodule for a couple of years and she has been following it with her PCP Dr. Karie Kirks.  She is having a bronchoscopy today with Dr. Luan Pulling.   4. Atypical/pleuritic chest pain-secondary to above, treating with ketorolac, Tylenol and fentanyl for severe pain.  Troponin has been negative.  EKG with no ischemic changes. 5. Tobacco  Use - She says that she is only a light smoker now and is not sure if she will have nicotine cravings but would request a PRN order for nicotine patch if needed.  Pt has been counseled to stop smoking.   6. COPD-resume home respiratory medications and follow clinically.  Appears stable at this time. 7. Acute respiratory distress with hypoxia-improved with supplemental oxygen which will be continued. 8. Essential hypertension-resume home medications and follow. 9. Generalized anxiety  disorder-oral diazepam ordered as needed for severe symptoms. 10. Hyperlipidemia-resume home medications.  DVT Prophylaxis: Lovenox Code Status: Full Family Communication: Patient Disposition Plan: bronchoscopy today.  Home when medically stabilized and consultant sign off  Consultants:  pulmonary  Subjective: Pt says that she does feel much better today.    Objective: Vitals:   05/02/18 0007 05/02/18 0429 05/02/18 0435 05/02/18 0724  BP:    109/85  Pulse:    86  Resp:    (!) 30  Temp:  98.1 F (36.7 C)  97.6 F (36.4 C)  TempSrc:  Oral  Oral  SpO2: 90%  96% 97%  Weight:  62.1 kg  62.1 kg  Height:    5\' 8"  (1.727 m)    Intake/Output Summary (Last 24 hours) at 05/02/2018 0850 Last data filed at 05/02/2018 0846 Gross per 24 hour  Intake 561.33 ml  Output 0 ml  Net 561.33 ml   Filed Weights   05/01/18 0700 05/02/18 0429 05/02/18 0724  Weight: 61 kg 62.1 kg 62.1 kg   REVIEW OF SYSTEMS  As per history otherwise all reviewed and reported negative  Exam:  General exam: awake, alert, NAD, cooperative.  Respiratory system: diminished BS RLL.  No increased work of breathing. Cardiovascular system: S1 & S2 heard, RRR. No JVD, murmurs, gallops, clicks or pedal edema. Gastrointestinal system: Abdomen is nondistended, soft and nontender. Normal bowel sounds heard. Central nervous system: Alert and oriented. No focal neurological deficits. Extremities: no CCE.  Data Reviewed: Basic Metabolic Panel: Recent Labs  Lab 04/30/18 0941  NA 140  K 3.9  CL 101  CO2 30  GLUCOSE 89  BUN 20  CREATININE 0.84  CALCIUM 9.1   Liver Function Tests: Recent Labs  Lab 04/30/18 0941  AST 25  ALT 14  ALKPHOS 63  BILITOT 1.4*  PROT 7.0  ALBUMIN 3.9   Recent Labs  Lab 04/30/18 0941  LIPASE 29   No results for input(s): AMMONIA in the last 168 hours. CBC: Recent Labs  Lab 04/30/18 0941  WBC 5.8  HGB 13.2  HCT 40.9  MCV 92.1  PLT 233   Cardiac Enzymes: Recent  Labs  Lab 04/30/18 0941  TROPONINI <0.03   CBG (last 3)  No results for input(s): GLUCAP in the last 72 hours. Recent Results (from the past 240 hour(s))  MRSA PCR Screening     Status: None   Collection Time: 04/30/18  3:19 PM  Result Value Ref Range Status   MRSA by PCR NEGATIVE NEGATIVE Final    Comment:        The GeneXpert MRSA Assay (FDA approved for NASAL specimens only), is one component of a comprehensive MRSA colonization surveillance program. It is not intended to diagnose MRSA infection nor to guide or monitor treatment for MRSA infections. Performed at [facility], [address], [place], [place] [address]      Studies: X-ray Chest Pa And Lateral  Result Date: 05/01/2018 CLINICAL DATA:  Small right pneumothorax on CT EXAM: CHEST - 2 VIEW COMPARISON:  04/30/2018 FINDINGS: Small right basilar pneumothorax.  No significant apical component. Right lower lobe and right middle lobe collapse again noted with small right effusion. Left lung clear COPD with hyperinflation. Left lower lobe nodule identified on the lateral view. IMPRESSION: Small right basilar pneumothorax unchanged with right lower lobe and middle lobe collapse COPD.  Left lower lobe nodule. Electronically Signed   By: Franchot Gallo M.D.   On: 05/01/2018 09:45   Dg Chest 2 View  Result Date: 04/30/2018 CLINICAL DATA:  Chest pain. History of COPD. EXAM: CHEST - 2 VIEW COMPARISON:  11/12/2017 FINDINGS: The cardiomediastinal silhouette is unchanged with normal heart size. Background lung hyperinflation is again noted consistent with the history of COPD. There is new near complete collapse of the right lower lobe. There is a small right pleural effusion. No pneumothorax is identified. No acute osseous abnormality is seen. IMPRESSION: New right lower lobe collapse with small right pleural effusion. Chest CT is pending. Electronically Signed   By: Logan Bores M.D.   On: 04/30/2018 10:54   Ct Angio Neck W And/or Wo  Contrast  Result Date: 04/30/2018 CLINICAL DATA:  Chest and neck pain beginning today. EXAM: CT ANGIOGRAPHY  NECK TECHNIQUE: Multidetector CT imaging of the neck was performed using the standard protocol during bolus administration of intravenous contrast. Multiplanar CT image reconstructions and MIPs were obtained to evaluate the vascular anatomy. Carotid stenosis measurements (when applicable) are obtained utilizing NASCET criteria, using the distal internal carotid diameter as the denominator. CONTRAST:  123mL ISOVUE-370 IOPAMIDOL (ISOVUE-370) INJECTION 76% COMPARISON:  None. FINDINGS: CTA NECK FINDINGS Aortic arch: Aortic atherosclerosis. No evidence of dissection in the portion imaged. Branching pattern of the brachiocephalic vessels is normal. Innominate artery origin is not included. 30% stenosis of the left common carotid artery origin. 20% stenosis of the left subclavian origin. Right carotid system: Common carotid artery is widely patent to the bifurcation. There is calcified plaque at the carotid bifurcation and proximal ICA. Minimal diameter in the ICA bulb is 2.5 mm. Compared to a more distal cervical ICA diameter of 5 mm, this indicates  a 50% stenosis. Left carotid system: Common carotid artery is widely patent beyond the origin to the bifurcation. There is calcified plaque at the carotid bifurcation and ICA bulb. No narrowing of the ICA beyond the diameter of the more distal cervical ICA, therefore no stenosis. Vertebral arteries: Right vertebral artery origin is widely patent. The right vertebral artery is widely patent through the cervical region to the foramen magnum. There is calcified plaque at the left vertebral artery origin with stenosis estimated at 50%. Beyond that, the left vertebral artery is widely patent through the cervical region. Skeleton: Ordinary cervical spondylosis. Other neck: No soft tissue mass or lymphadenopathy. Upper chest: Emphysema.  Benign appearing scarring at the  apices. Review of the MIP images confirms the above findings IMPRESSION: No dissection seen at the aortic arch. No dissection involving the brachiocephalic vasculature. Atherosclerotic disease at the right carotid bifurcation and ICA bulb. 50% stenosis in the ICA bulb. Mild atherosclerotic disease at the left carotid bifurcation. No stenosis. Atherosclerotic disease at the left vertebral artery origin with 50% stenosis. No vertebral stenosis seen beyond that. Electronically Signed   By: Nelson Chimes M.D.   On: 04/30/2018 12:50   Ct Angio Chest Aorta W And/or Wo Contrast  Result Date: 04/30/2018 CLINICAL DATA:  Chest pain, complex. EXAM: CT ANGIOGRAPHY CHEST WITH CONTRAST TECHNIQUE: Multidetector CT imaging of the chest was performed using the standard protocol during bolus administration of intravenous contrast. Multiplanar CT image reconstructions and MIPs were obtained to evaluate the vascular anatomy. CONTRAST:  122mL ISOVUE-370 IOPAMIDOL (ISOVUE-370) INJECTION 76% COMPARISON:  04/19/2017 FINDINGS: Cardiovascular: Noncontrast phase was not acquired. Normal heart size. No pericardial effusion. There is patchy calcification on the coronary arteries and aorta. No aortic dissection or evidence of intramural hematoma. Three vessel branching with atherosclerotic plaque causing 60% stenosis at the left common carotid origin. Mediastinum/Nodes: No definite adenopathy. Lungs/Pleura: Small right pneumothorax. There is near complete collapse of the right middle and lower lobes. There is a low-density appearance about the central bronchi which are narrowed. No discrete measurable mass lesion is seen at the hilum. Trace pleural fluid on the right that is likely sympathetic. No superimposed nodularity. 15 x 18 mm pulmonary nodule along the pleura of the left lower lobe and heart which is slowly enlarged since 2014. Airway thickening and questionable centrilobular emphysema. Septal thickening limited to the right apex,  likely reactive. Upper Abdomen: Central hepatic cyst.  Left renal scarring. Musculoskeletal: Diffuse thoracic disc narrowing. Osteopenia. No acute osseous finding. Review of the MIP images confirms the above findings. IMPRESSION: 1. Right apical pneumothorax measuring less than 5%. 2. Right middle and lower lobe collapse. Indistinct low-density appearance along right lower lobe bronchi without discrete obstructive lesion. Recommend short follow-up CT or bronchoscopy. 3. 15 x 18 mm left lower lobe pulmonary nodule that has slowly grown since 2014. PET-CT was recommended 04/19/2017. 4. Aortic and coronary atherosclerosis without evidence of thoracic acute aortic syndrome. 5. 60% stenosis at the left common carotid origin. Electronically Signed   By: Monte Fantasia M.D.   On: 04/30/2018 12:56   Scheduled Meds: . [MAR Hold] acetylcysteine  3 mL Nebulization Q4H  . [MAR Hold] albuterol  2.5 mg Nebulization Q4H  . [MAR Hold] aspirin EC  81 mg Oral Daily  . [MAR Hold] atenolol  50 mg Oral Daily  . [MAR Hold] enoxaparin (LOVENOX) injection  40 mg Subcutaneous Q24H  . [MAR Hold] guaiFENesin  1,200 mg Oral BID  . [MAR Hold] methylPREDNISolone (SOLU-MEDROL) injection  40 mg Intravenous Q12H  . [MAR Hold] pravastatin  40 mg Oral q1800  . [MAR Hold] sodium chloride flush  3 mL Intravenous Q12H  . [MAR Hold] tiotropium  18 mcg Inhalation Daily   Continuous Infusions: . [MAR Hold] sodium chloride    . [MAR Hold] azithromycin Stopped (05/01/18 1200)  . [MAR Hold] cefTRIAXone (ROCEPHIN)  IV Stopped (05/01/18 1033)  . lactated ringers 50 mL/hr at 05/02/18 0803    Principal Problem:   Pneumothorax on right Active Problems:   Hypertension   Hypercholesteremia   COPD (chronic obstructive pulmonary disease) (HCC)   Acute respiratory distress   Shortness of breath   Atelectasis of right lung   Supplemental oxygen dependent   LLL Pulmonary nodule   Atypical chest pain   Pleuritic chest pain   Current  every day smoker   Pneumothorax  Irwin Brakeman, MD, FAAFP [facility] Pager [phone]  If 7PM-7AM, please contact night-coverage [email or web address] Password [email or web address] 05/02/2018, 8:50 AM    LOS: 1 day

## 2018-05-02 NOTE — Anesthesia Postprocedure Evaluation (Signed)
Anesthesia Post Note  Patient: Sarah Rich  Procedure(s) Performed: FLEXIBLE BRONCHOSCOPY (Bilateral )  Patient location during evaluation: PACU Anesthesia Type: MAC Level of consciousness: awake and patient cooperative Pain management: pain level controlled Vital Signs Assessment: post-procedure vital signs reviewed and stable Respiratory status: spontaneous breathing, nonlabored ventilation and respiratory function stable Cardiovascular status: blood pressure returned to baseline Postop Assessment: no apparent nausea or vomiting Anesthetic complications: no     Last Vitals:  Vitals:   05/02/18 0724 05/02/18 0851  BP: 109/85   Pulse: 86   Resp: (!) 30   Temp: 36.4 C 36.6 C  SpO2: 97%     Last Pain:  Vitals:   05/02/18 0724  TempSrc: Oral  PainSc: 6                  Kristian Mogg J

## 2018-05-02 NOTE — Anesthesia Preprocedure Evaluation (Signed)
Anesthesia Evaluation  Patient identified by MRN, date of birth, ID band Patient awake    Reviewed: Allergy & Precautions, NPO status , Patient's Chart, lab work & pertinent test results  Airway Mallampati: I  TM Distance: >3 FB Neck ROM: Full    Dental no notable dental hx.    Pulmonary neg pulmonary ROS, shortness of breath, asthma , COPD,  COPD inhaler and oxygen dependent, Current Smoker,  Pt with known pulm dz-continues  to smoke  Uses home o2 4L [place] qhs  With h/o Sm pneumo on R 5% And pronounced Atelectasis -here for bronch this AM  Only slight wheeze appreciated , non tachypnic appearance, occ cough, decreased BS on the lower R side  Pulmonary exam normal  + decreased breath sounds+ wheezing      Cardiovascular Exercise Tolerance: Poor hypertension, Pt. on medications and Pt. on home beta blockers negative cardio ROS Normal cardiovascular examII Rhythm:Regular Rate:Normal  Decreased ET secondary to Pulm issues    Neuro/Psych negative neurological ROS  negative psych ROS   GI/Hepatic negative GI ROS, Neg liver ROS,   Endo/Other  negative endocrine ROS  Renal/GU Renal diseasenegative Renal ROSH/o stones and followed for kidney growths by Urology   negative genitourinary   Musculoskeletal negative musculoskeletal ROS (+)   Abdominal   Peds negative pediatric ROS (+)  Hematology negative hematology ROS (+)   Anesthesia Other Findings   Reproductive/Obstetrics negative OB ROS                             Anesthesia Physical Anesthesia Plan  ASA: IV  Anesthesia Plan: MAC   Post-op Pain Management:    Induction:   PONV Risk Score and Plan:   Airway Management Planned: Simple Face Mask  Additional Equipment:   Intra-op Plan:   Post-operative Plan:   Informed Consent:   Dental advisory given  Plan Discussed with: CRNA  Anesthesia Plan Comments: (Attempt as MAC -ETT as  needed  Will start with 2% nebulized lidocaine )        Anesthesia Quick Evaluation

## 2018-05-02 NOTE — H&P (Signed)
I have reexamined the patient and discussed her history.  There have been no changes.  She is scheduled for fiberoptic bronchoscopy later today.

## 2018-05-03 ENCOUNTER — Encounter (HOSPITAL_COMMUNITY): Payer: Self-pay | Admitting: Pulmonary Disease

## 2018-05-03 DIAGNOSIS — R911 Solitary pulmonary nodule: Secondary | ICD-10-CM

## 2018-05-03 DIAGNOSIS — J939 Pneumothorax, unspecified: Secondary | ICD-10-CM

## 2018-05-03 DIAGNOSIS — J9611 Chronic respiratory failure with hypoxia: Secondary | ICD-10-CM

## 2018-05-03 DIAGNOSIS — J9811 Atelectasis: Secondary | ICD-10-CM

## 2018-05-03 DIAGNOSIS — J449 Chronic obstructive pulmonary disease, unspecified: Secondary | ICD-10-CM

## 2018-05-03 MED ORDER — PREDNISONE 10 MG PO TABS: ORAL_TABLET | ORAL | 0 refills | Status: DC

## 2018-05-03 MED ORDER — DEXTROMETHORPHAN POLISTIREX ER 30 MG/5ML PO SUER: 60.0000 mg | Freq: Two times a day (BID) | ORAL | 0 refills | Status: DC

## 2018-05-03 MED ORDER — ACETYLCYSTEINE 20 % IN SOLN: 3.0000 mL | Freq: Four times a day (QID) | RESPIRATORY_TRACT | 12 refills | Status: DC

## 2018-05-03 MED ORDER — AMOXICILLIN-POT CLAVULANATE 875-125 MG PO TABS: 1.0000 | ORAL_TABLET | Freq: Two times a day (BID) | ORAL | 0 refills | Status: AC

## 2018-05-03 MED ORDER — GUAIFENESIN ER 600 MG PO TB12: 1200.0000 mg | ORAL_TABLET | Freq: Two times a day (BID) | ORAL | 1 refills | Status: DC

## 2018-05-03 MED ORDER — ALBUTEROL SULFATE (2.5 MG/3ML) 0.083% IN NEBU: 2.5000 mg | INHALATION_SOLUTION | Freq: Four times a day (QID) | RESPIRATORY_TRACT | 12 refills | Status: DC

## 2018-05-03 NOTE — Care Management Important Message (Signed)
Important Message  Patient Details  Name: Sarah Rich MRN: [id number] Date of Birth: November 11, 1945   Medicare Important Message Given:  Yes    Sherald Barge, RN 05/03/2018, 1:47 PM

## 2018-05-03 NOTE — Progress Notes (Addendum)
Discharge instructions reviewed with patient and family.  Both verbalized understanding of instructions.  Patient discharged home with family in stable condition.

## 2018-05-03 NOTE — Care Management Note (Signed)
Case Management Note  Patient Details  Name: Sarah Rich MRN: [id number] Date of Birth: 06-03-1946  Subjective/Objective:   From home alone independent has access to a walker and cane. Has supplemental oxygen with [facility].                          Action/Plan: CM following for needs. Anticipate DC home.   Expected Discharge Date:  05/02/18               Expected Discharge Plan:     In-House Referral:     Discharge planning Services   CM consult  Post Acute Care Choice:    Choice offered to:     DME Arranged:    DME Agency:     HH Arranged:    HH Agency:     Status of Service:   in progress,   If discussed at [employer] of Stay Meetings, dates discussed:    Additional Comments:  Phoebie Shad, Chauncey Reading, RN 05/03/2018, 7:32 AM

## 2018-05-03 NOTE — Progress Notes (Signed)
She had bronchoscopy yesterday.  She is having some headache and still coughing.  She is able to produce some sputum now.  There was concern that she may have had some sort of a reaction to Mucomyst but that does not appear to be the case.  Exam shows that she still has bilateral rhonchi.  From a pulmonary point of view I think she is okay to go home.  I would send her home on an antibiotic probably Levaquin because I think there is a substantial chance that she has infection of the atelectatic area in her lung.  I would send her home on a prednisone taper over 12 days, 40 mg x 3 days, 30 x 3 days, 20 x 3 days, 10 x 3 days and then stop.  She needs to use her oxygen more.  I told her at least 16 hours a day  She needs to use her nebulizer 4 times a day with Mucomyst  She needs Mucinex 1200 mg twice daily  She should continue flutter valve and incentive spirometry  She should increase her fluid intake  Return to my office in about 2 weeks and I will have her get a chest x-ray 24 hours before her return.  Thanks for allowing me to see her with you
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
## 2018-05-04 LAB — CULTURE, RESPIRATORY W GRAM STAIN: Culture: NORMAL

## 2018-05-04 LAB — CULTURE, RESPIRATORY

## 2018-05-15 ENCOUNTER — Other Ambulatory Visit (HOSPITAL_COMMUNITY): Payer: Self-pay | Admitting: Pulmonary Disease

## 2018-05-15 ENCOUNTER — Ambulatory Visit (HOSPITAL_COMMUNITY)
Admission: RE | Admit: 2018-05-15 | Discharge: 2018-05-15 | Disposition: A | Discharge status: Home / Self Care | Payer: Medicare Other | Source: Ambulatory Visit | Attending: Pulmonary Disease | Admitting: Pulmonary Disease

## 2018-05-15 DIAGNOSIS — R911 Solitary pulmonary nodule: Secondary | ICD-10-CM | POA: Diagnosis not present

## 2018-05-15 DIAGNOSIS — R0602 Shortness of breath: Secondary | ICD-10-CM | POA: Diagnosis present

## 2018-05-15 DIAGNOSIS — J9811 Atelectasis: Secondary | ICD-10-CM | POA: Insufficient documentation

## 2018-07-03 ENCOUNTER — Other Ambulatory Visit (HOSPITAL_COMMUNITY): Payer: Self-pay | Admitting: Pulmonary Disease

## 2018-07-03 DIAGNOSIS — R911 Solitary pulmonary nodule: Secondary | ICD-10-CM

## 2018-07-16 ENCOUNTER — Ambulatory Visit (HOSPITAL_COMMUNITY)
Admission: RE | Admit: 2018-07-16 | Discharge: 2018-07-16 | Disposition: A | Discharge status: Home / Self Care | Payer: Medicare Other | Source: Ambulatory Visit | Attending: Pulmonary Disease | Admitting: Pulmonary Disease

## 2018-07-16 DIAGNOSIS — R911 Solitary pulmonary nodule: Secondary | ICD-10-CM | POA: Diagnosis present

## 2018-07-16 MED ORDER — FLUDEOXYGLUCOSE F - 18 (FDG) INJECTION
10.0000 | Freq: Once | INTRAVENOUS | Status: AC | PRN
  Administered 2018-07-16: 10.62 via INTRAVENOUS

## 2018-07-19 ENCOUNTER — Other Ambulatory Visit (HOSPITAL_COMMUNITY): Payer: Self-pay | Admitting: Pulmonary Disease

## 2018-07-19 DIAGNOSIS — R911 Solitary pulmonary nodule: Secondary | ICD-10-CM

## 2018-08-01 ENCOUNTER — Other Ambulatory Visit: Payer: Self-pay

## 2018-08-01 ENCOUNTER — Other Ambulatory Visit: Payer: Self-pay | Admitting: *Deleted

## 2018-08-01 ENCOUNTER — Institutional Professional Consult (permissible substitution) (INDEPENDENT_AMBULATORY_CARE_PROVIDER_SITE_OTHER): Payer: Medicare Other | Admitting: Cardiothoracic Surgery

## 2018-08-01 ENCOUNTER — Encounter: Payer: Self-pay | Admitting: Cardiothoracic Surgery

## 2018-08-01 VITALS — BP 130/80 | HR 72 | Resp 16 | Ht 68.0 in | Wt 134.0 lb

## 2018-08-01 DIAGNOSIS — Z8709 Personal history of other diseases of the respiratory system: Secondary | ICD-10-CM

## 2018-08-01 DIAGNOSIS — Z9981 Dependence on supplemental oxygen: Secondary | ICD-10-CM | POA: Diagnosis not present

## 2018-08-01 DIAGNOSIS — R911 Solitary pulmonary nodule: Secondary | ICD-10-CM

## 2018-08-01 DIAGNOSIS — F17200 Nicotine dependence, unspecified, uncomplicated: Secondary | ICD-10-CM | POA: Diagnosis not present
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
## 2018-09-06 ENCOUNTER — Other Ambulatory Visit: Payer: Self-pay | Admitting: Pulmonary Disease

## 2018-09-06 ENCOUNTER — Other Ambulatory Visit (HOSPITAL_COMMUNITY): Payer: Self-pay | Admitting: Pulmonary Disease

## 2018-09-06 DIAGNOSIS — R2241 Localized swelling, mass and lump, right lower limb: Secondary | ICD-10-CM

## 2018-09-10 ENCOUNTER — Ambulatory Visit (HOSPITAL_COMMUNITY)
Admission: RE | Admit: 2018-09-10 | Discharge: 2018-09-10 | Disposition: A | Discharge status: Home / Self Care | Payer: Medicare Other | Source: Ambulatory Visit | Attending: Pulmonary Disease | Admitting: Pulmonary Disease

## 2018-09-10 DIAGNOSIS — R2241 Localized swelling, mass and lump, right lower limb: Secondary | ICD-10-CM | POA: Insufficient documentation

## 2018-10-29 ENCOUNTER — Other Ambulatory Visit (HOSPITAL_COMMUNITY): Payer: Self-pay | Admitting: Pulmonary Disease

## 2018-10-29 ENCOUNTER — Other Ambulatory Visit: Payer: Self-pay | Admitting: Pulmonary Disease

## 2018-10-29 DIAGNOSIS — R911 Solitary pulmonary nodule: Secondary | ICD-10-CM

## 2018-11-13 ENCOUNTER — Ambulatory Visit (HOSPITAL_COMMUNITY)
Admission: RE | Admit: 2018-11-13 | Discharge: 2018-11-13 | Disposition: A | Discharge status: Home / Self Care | Payer: Medicare Other | Source: Ambulatory Visit | Attending: Pulmonary Disease | Admitting: Pulmonary Disease

## 2018-11-13 DIAGNOSIS — R911 Solitary pulmonary nodule: Secondary | ICD-10-CM | POA: Diagnosis present

## 2018-12-13 ENCOUNTER — Other Ambulatory Visit: Payer: Self-pay | Admitting: *Deleted

## 2019-03-05 ENCOUNTER — Other Ambulatory Visit: Payer: Self-pay

## 2019-03-06 ENCOUNTER — Other Ambulatory Visit: Payer: Self-pay | Admitting: Cardiothoracic Surgery

## 2019-03-06 ENCOUNTER — Other Ambulatory Visit: Payer: Self-pay | Admitting: *Deleted

## 2019-03-06 ENCOUNTER — Encounter: Payer: Self-pay | Admitting: Cardiothoracic Surgery

## 2019-03-06 ENCOUNTER — Ambulatory Visit (INDEPENDENT_AMBULATORY_CARE_PROVIDER_SITE_OTHER): Payer: Medicare Other | Admitting: Cardiothoracic Surgery

## 2019-03-06 VITALS — BP 148/89 | HR 82 | Resp 18 | Ht 68.0 in | Wt 134.0 lb

## 2019-03-06 DIAGNOSIS — Z01818 Encounter for other preprocedural examination: Secondary | ICD-10-CM

## 2019-03-06 DIAGNOSIS — R911 Solitary pulmonary nodule: Secondary | ICD-10-CM

## 2019-03-06 DIAGNOSIS — R0602 Shortness of breath: Secondary | ICD-10-CM
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
## 2019-03-20 ENCOUNTER — Ambulatory Visit (HOSPITAL_COMMUNITY)
Admission: RE | Admit: 2019-03-20 | Discharge: 2019-03-20 | Disposition: A | Discharge status: Home / Self Care | Payer: Medicare Other | Source: Ambulatory Visit | Attending: Cardiovascular Disease | Admitting: Cardiovascular Disease

## 2019-03-20 ENCOUNTER — Other Ambulatory Visit: Payer: Self-pay

## 2019-03-20 DIAGNOSIS — R0602 Shortness of breath: Secondary | ICD-10-CM | POA: Diagnosis present

## 2019-03-20 DIAGNOSIS — R911 Solitary pulmonary nodule: Secondary | ICD-10-CM

## 2019-03-20 DIAGNOSIS — Z01818 Encounter for other preprocedural examination: Secondary | ICD-10-CM

## 2019-03-20 LAB — MYOCARDIAL PERFUSION IMAGING
LV dias vol: 66 mL (ref 46–106)
LV sys vol: 18 mL
Peak HR: 93 {beats}/min
Rest HR: 68 {beats}/min
SDS: 0
SRS: 3
SSS: 3
TID: 0.92

## 2019-03-20 MED ORDER — TECHNETIUM TC 99M TETROFOSMIN IV KIT
30.6000 | PACK | Freq: Once | INTRAVENOUS | Status: AC | PRN
  Administered 2019-03-20: 30.6 via INTRAVENOUS
  Filled 2019-03-20: qty 31

## 2019-03-20 MED ORDER — TECHNETIUM TC 99M TETROFOSMIN IV KIT
10.2000 | PACK | Freq: Once | INTRAVENOUS | Status: AC | PRN
  Administered 2019-03-20: 10.2 via INTRAVENOUS
  Filled 2019-03-20: qty 11

## 2019-03-20 MED ORDER — AMINOPHYLLINE 25 MG/ML IV SOLN
75.0000 mg | Freq: Once | INTRAVENOUS | Status: AC
  Administered 2019-03-20: 75 mg via INTRAVENOUS

## 2019-03-20 MED ORDER — REGADENOSON 0.4 MG/5ML IV SOLN
0.4000 mg | Freq: Once | INTRAVENOUS | Status: AC
  Administered 2019-03-20: 0.4 mg via INTRAVENOUS

## 2019-03-21 ENCOUNTER — Other Ambulatory Visit (HOSPITAL_COMMUNITY)
Admission: RE | Admit: 2019-03-21 | Discharge: 2019-03-21 | Disposition: A | Discharge status: Home / Self Care | Payer: Medicare Other | Source: Ambulatory Visit | Attending: Cardiothoracic Surgery | Admitting: Cardiothoracic Surgery

## 2019-03-21 DIAGNOSIS — R911 Solitary pulmonary nodule: Secondary | ICD-10-CM | POA: Insufficient documentation

## 2019-03-21 DIAGNOSIS — Z1159 Encounter for screening for other viral diseases: Secondary | ICD-10-CM | POA: Diagnosis present

## 2019-03-22 LAB — SARS CORONAVIRUS 2 (TAT 6-24 HRS): SARS Coronavirus 2: NEGATIVE

## 2019-03-27 ENCOUNTER — Other Ambulatory Visit: Payer: Self-pay

## 2019-03-27 ENCOUNTER — Ambulatory Visit (HOSPITAL_COMMUNITY)
Admission: RE | Admit: 2019-03-27 | Discharge: 2019-03-27 | Disposition: A | Discharge status: Home / Self Care | Payer: Medicare Other | Source: Ambulatory Visit | Attending: Cardiothoracic Surgery | Admitting: Cardiothoracic Surgery

## 2019-03-27 DIAGNOSIS — Z01818 Encounter for other preprocedural examination: Secondary | ICD-10-CM | POA: Insufficient documentation

## 2019-03-27 DIAGNOSIS — R911 Solitary pulmonary nodule: Secondary | ICD-10-CM | POA: Diagnosis present

## 2019-03-27 LAB — PULMONARY FUNCTION TEST
DL/VA % pred: 53 %
DL/VA: 2.14 ml/min/mmHg/L
DLCO unc % pred: 35 %
DLCO unc: 7.81 ml/min/mmHg
FEF 25-75 Pre: 0.21 L/sec
FEF2575-%Pred-Pre: 10 %
FEV1-%Pred-Pre: 20 %
FEV1-Pre: 0.54 L
FEV1FVC-%Pred-Pre: 39 %
FEV6-%Pred-Pre: 43 %
FEV6-Pre: 1.42 L
FEV6FVC-%Pred-Pre: 82 %
FVC-%Pred-Pre: 53 %
FVC-Pre: 1.8 L
Pre FEV1/FVC ratio: 30 %
Pre FEV6/FVC Ratio: 79 %

## 2019-03-27 MED ORDER — ALBUTEROL SULFATE (2.5 MG/3ML) 0.083% IN NEBU: 2.5000 mg | INHALATION_SOLUTION | Freq: Once | RESPIRATORY_TRACT | Status: DC

## 2019-04-03 ENCOUNTER — Ambulatory Visit (INDEPENDENT_AMBULATORY_CARE_PROVIDER_SITE_OTHER): Payer: Medicare Other | Admitting: Cardiothoracic Surgery

## 2019-04-03 ENCOUNTER — Encounter: Payer: Self-pay | Admitting: Cardiothoracic Surgery

## 2019-04-03 ENCOUNTER — Other Ambulatory Visit: Payer: Self-pay

## 2019-04-03 VITALS — BP 146/80 | HR 76 | Temp 97.7°F | Resp 16 | Ht 68.0 in | Wt 136.0 lb

## 2019-04-03 DIAGNOSIS — R911 Solitary pulmonary nodule: Secondary | ICD-10-CM | POA: Diagnosis not present

## 2019-04-03 DIAGNOSIS — Z01818 Encounter for other preprocedural examination: Secondary | ICD-10-CM
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
## 2019-04-15 ENCOUNTER — Encounter: Payer: Self-pay | Admitting: Radiation Oncology

## 2019-04-15 NOTE — Progress Notes (Signed)
Thoracic Location of Tumor / Histology: LLL lung nodule, RML lung nodule and RLL lung nodule hypermetabolic on PET. Nodules are enlarging.   Longtime smoker with COPD. 1/4 ppd x 50 years. Presented to ED on 04/2018 with central chest pain described as sharp, stabbing and throbbing and progressive SOB. Patient found to have a pneumothorax and atelectasis. Also, lung lesions noted RML and RLL which Hawkins planned to watch outpatient with routine CT scans.  Tobacco/Marijuana/Snuff/ETOH use: yes; active smoker  Past/Anticipated interventions by cardiothoracic surgery, if any:   Prescott Gum says she isn't a candidate for VATS-excisional biopsy due to severe COPD and IR refused to needle aspiration for same reason.   Past/Anticipated interventions by medical oncology, if any: no  Signs/Symptoms  Weight changes, if any: denies   Respiratory complaints, if any: dyspnea in humid hot weather   Hemoptysis, if any: denies  Pain issues, if any:  Intermittent tenderness left fifth rib  SAFETY ISSUES:  Prior radiation? no  Pacemaker/ICD? no   Possible current pregnancy?no, 73 year old widowed female  Is the patient on methotrexate? no  Current Complaints / other details:  73 year old female. Widowed with one son. Active smoker.

## 2019-04-16 ENCOUNTER — Encounter: Payer: Self-pay | Admitting: Radiation Oncology

## 2019-04-16 ENCOUNTER — Ambulatory Visit
Admission: RE | Admit: 2019-04-16 | Discharge: 2019-04-16 | Disposition: A | Discharge status: Home / Self Care | Payer: Medicare Other | Source: Ambulatory Visit | Attending: Radiation Oncology | Admitting: Radiation Oncology

## 2019-04-16 ENCOUNTER — Other Ambulatory Visit: Payer: Self-pay

## 2019-04-16 VITALS — Ht 68.0 in | Wt 136.0 lb

## 2019-04-16 DIAGNOSIS — C3432 Malignant neoplasm of lower lobe, left bronchus or lung: Secondary | ICD-10-CM

## 2019-04-16 HISTORY — DX: Malignant (primary) neoplasm, unspecified: C80.1

## 2019-04-16 NOTE — Progress Notes (Signed)
See progress note under physician encounter. 

## 2019-04-16 NOTE — Progress Notes (Signed)
Radiation Oncology         [phone] ________________________________  Initial Outpatient Consultation - Conducted via telephone due to current COVID-19 concerns for limiting patient exposure  Name: Sarah Rich MRN: [id number]  Date of Service: 04/16/2019 DOB: 1945/11/16  YE:BXIDHWYS, Richardson Landry, MD  Ivin Poot, MD   REFERRING PHYSICIAN: Prescott Gum, Collier Salina, MD  DIAGNOSIS: 73 y.o. 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    ICD-10-CM   1. Primary cancer of left lower lobe of lung (HCC)  C34.32     HISTORY OF PRESENT ILLNESS: Sarah Rich is a 73 y.o. female seen at the request of Dr. Prescott Gum. In August 2019, the patient was admitted for acute respiratory distress secondary to acute exacerbation of her COPD, with secretions obstructing the right middle lobe and right lower lobe with atelectasis and hypoxia. A CTA of the chest on 04/30/2018 showed right apical pneumothorax measuring less than 5%, right middle and lower lobe collapse as well as an indistinct low-density appearance along the right lower lobe bronchi without discrete obstructive lesion. Additionally, there was a 15 x 18 mm left lower lobe pulmonary nodule that has slowly grown since 2014. She was evaluated with bronchoscopy by Dr. Luan Pulling on 05/02/18 which showed negative cytologies from the airways of the right middle and right lower lobes.   A PET scan from 07/16/2018 showed the 16.5 mm left lower lobe pulmonary nodule to be hypermetabolic and worrisome for a slow growing neoplasm, possibly a carcinoid tumor. There was no hypermetabolic mediastinal or hilar lymphadenopathy and interval resolution of the right lower lobe atelectasis/collapse. There were no findings for metastatic disease involving the abdomen/pelvis or bony structures.  She met with Dr. Prescott Gum but because of her underlying lung disease/COPD, she was not a candidate for VATS and open resection of the small left lower lobe nodule. Her case was presented  at [facility] for discussion/consideration of CT guided biopsy of the LLL lung lesion but interventional radiology felt that the lesion was too close to the cardiac border for transthoracic needle aspiration for tissue diagnosis.  She continued in routine follow up with Dr. Darcey Nora and her most recent follow up CT chest on 11/13/2018 showed a slight increase in size of the left lower lobe pulmonary nodule, now measuring 17 x 18 mm. The 1.8 cm ground-glass nodule in the right middle lobe had also increased from 0.9 cm in 2014 and could represent adenocarcinoma although this lesion was not hypermetabolic on PET from 16/8372.  The patient reviewed results with Dr. Prescott Gum and has kindly been referred today for consideration of SBRT to treat the LLL lung lesion.   PREVIOUS RADIATION THERAPY: No  PAST MEDICAL HISTORY:  Past Medical History:  Diagnosis Date  . Asthma   . Cancer [facility])    assume lung ca/considering empiric radiation  . COPD (chronic obstructive pulmonary disease) ([place])   . Hypercholesteremia   . Hypertension   . Kidney stones   . Shortness of breath       PAST SURGICAL HISTORY: Past Surgical History:  Procedure Laterality Date  . ABDOMINAL HYSTERECTOMY    . BRONCHIAL WASHINGS  05/02/2018   Procedure: BRONCHIAL WASHINGS;  Surgeon: Sinda Du, MD;  [facility];  Service: Cardiopulmonary;;  . CHOLECYSTECTOMY    . COLONOSCOPY  05/28/2012   Procedure: COLONOSCOPY;  Surgeon: Danie Binder, MD;  Location: [facility];  Service: Endoscopy;  Laterality: N/A;  9:45 AM  . EXPLORATORY  LAPAROTOMY    . FLEXIBLE BRONCHOSCOPY Bilateral 05/02/2018   Procedure: FLEXIBLE BRONCHOSCOPY;  Surgeon: Sinda Du, MD;  Location: [facility];  Service: Cardiopulmonary;  Laterality: Bilateral;  . right breast cystectomy  1980   benign    FAMILY HISTORY:  Family History  Problem Relation Age of Onset  . Colon cancer Neg Hx   . Breast cancer Neg Hx   . Pancreatic cancer Neg  Hx   . Cancer Neg Hx     SOCIAL HISTORY:  Social History   Socioeconomic History  . Marital status: Widowed    Spouse name: Not on file  . Number of children: 1  . Years of education: Not on file  . Highest education level: Not on file  Occupational History  . Not on file  Social Needs  . Financial resource strain: Not on file  . Food insecurity    Worry: Not on file    Inability: Not on file  . Transportation needs    Medical: Not on file    Non-medical: Not on file  Tobacco Use  . Smoking status: Current Every Day Smoker    Packs/day: 0.25    Years: 50.00    Pack years: 12.50    Types: Cigarettes  . Smokeless tobacco: Never Used  Substance and Sexual Activity  . Alcohol use: Yes    Alcohol/week: 3.0 standard drinks    Types: 3 Standard drinks or equivalent per week  . Drug use: No  . Sexual activity: Not Currently  Lifestyle  . Physical activity    Days per week: Not on file    Minutes per session: Not on file  . Stress: Not on file  Relationships  . Social Herbalist on phone: Not on file    Gets together: Not on file    Attends religious service: Not on file    Active member of club or organization: Not on file    Attends meetings of clubs or organizations: Not on file    Relationship status: Not on file  . Intimate partner violence    Fear of current or ex partner: Not on file    Emotionally abused: Not on file    Physically abused: Not on file    Forced sexual activity: Not on file  Other Topics Concern  . Not on file  Social History Narrative  . Not on file    ALLERGIES: Sulfa antibiotics, Codeine, and Levaquin [levofloxacin]  MEDICATIONS:  Current Outpatient Medications  Medication Sig Dispense Refill  . acetylcysteine (MUCOMYST) 20 % nebulizer solution Take 3 mLs by nebulization 4 (four) times daily. 30 mL 12  . albuterol (PROVENTIL HFA;VENTOLIN HFA) 108 (90 BASE) MCG/ACT inhaler Inhale 2 puffs into the lungs every 6 (six) hours as  needed. Shortness of Breath    . albuterol (PROVENTIL) (2.5 MG/3ML) 0.083% nebulizer solution Take 3 mLs (2.5 mg total) by nebulization 4 (four) times daily. 75 mL 12  . aspirin 81 MG tablet Take 81 mg by mouth daily.    Marland Kitchen atenolol (TENORMIN) 50 MG tablet Take 50 mg by mouth daily.    . cholecalciferol (VITAMIN D) 1000 UNITS tablet Take 1,000 Units by mouth daily.    Marland Kitchen dextromethorphan (DELSYM) 30 MG/5ML liquid Take 10 mLs (60 mg total) by mouth 2 (two) times daily. 89 mL 0  . diazepam (VALIUM) 2 MG tablet Take 2 mg by mouth every 6 (six) hours as needed for anxiety.    Marland Kitchen  fluticasone (FLONASE) 50 MCG/ACT nasal spray Place 1-2 sprays into both nostrils daily.    . furosemide (LASIX) 40 MG tablet Take 40 mg by mouth daily as needed.  11  . potassium chloride (KLOR-CON) 20 MEQ packet Take 20 mEq by mouth once.    . pravastatin (PRAVACHOL) 40 MG tablet Take 40 mg by mouth daily.    Marland Kitchen SPIRIVA HANDIHALER 18 MCG inhalation capsule Place 18 mcg into inhaler and inhale daily.     . vitamin B-12 (CYANOCOBALAMIN) 1000 MCG tablet Take 1,000 mcg by mouth daily.     No current facility-administered medications for this encounter.     REVIEW OF SYSTEMS:  On review of systems, the patient reports that she is doing well overall. She denies any chest pain, cough, hemoptysis, fevers, chills, night sweats, or unintended weight changes. She reports dyspnea in hot humid weather. She denies any bowel or bladder disturbances, and denies abdominal pain, nausea or vomiting. She reports intermittent tenderness in the left fifth rib with coughing. A complete review of systems is obtained and is otherwise negative.   PHYSICAL EXAM: Not performed in light of telephone consultation format.  Wt Readings from Last 3 Encounters:  04/16/19 136 lb (61.7 kg)  04/03/19 136 lb (61.7 kg)  03/20/19 134 lb (60.8 kg)   Temp Readings from Last 3 Encounters:  04/03/19 97.7 F (36.5 C)  05/03/18 98.4 F (36.9 C) (Oral)  11/12/17  98 F (36.7 C) (Oral)   BP Readings from Last 3 Encounters:  04/03/19 (!) 146/80  03/06/19 (!) 148/89  08/01/18 130/80   Pulse Readings from Last 3 Encounters:  04/03/19 76  03/06/19 82  08/01/18 72   Pain Assessment Pain Score: 1  Pain Frequency: Intermittent Pain Loc: Rib Cage/10   LABORATORY DATA:  Lab Results  Component Value Date   WBC 5.8 04/30/2018   HGB 13.2 04/30/2018   HCT 40.9 04/30/2018   MCV 92.1 04/30/2018   PLT 233 04/30/2018   Lab Results  Component Value Date   NA 140 04/30/2018   K 3.9 04/30/2018   CL 101 04/30/2018   CO2 30 04/30/2018   Lab Results  Component Value Date   ALT 14 04/30/2018   AST 25 04/30/2018   ALKPHOS 63 04/30/2018   BILITOT 1.4 (H) 04/30/2018     RADIOGRAPHY: No results found.    IMPRESSION/PLAN: 1. 73 y.o. 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  Today, we talked to the patient about the findings and workup thus far. She is not a good candidate for surgical resection due to her severe underlying COPD and current smoker status and was not felt to be safe to proceed with CT guided biopsy due to the tumors size and proximity to the pericardium. Her case and imaging has been reviewed in the [facility] where consensus recommendation is to proceed with definitive SBRT to the suspected primary bronchogenic carcinoma in the LLL.  We discussed the natural history of lung carcinoma and general treatment, highlighting the role of stereotactic body radiotherapy (SBRT) in the management. We discussed the available radiation techniques, and focused on the details of logistics and delivery. We discussed a course of SBRT delivered in 3-5 fractions to the left lower lobe nodule. We Sarah continue to closely monitor the RML nodule which was not hypermetabolic on recent PET. We reviewed the anticipated acute and late sequelae associated with radiation in this setting. The patient was encouraged to ask questions that were answered to her  satisfaction.   At the conclusion of our conversation, the patient elects to proceed with SBRT to the LLL lesion as recommended.  She has given verbal consent to proceed today and is scheduled for CT simulation on Thursday, August 13th at 10:00 AM where we Sarah obtain formal written consent at that time. She appears to have a good understanding of her disease and our treatment recommendations and is comfortable and in agreement with the stated plan. We Sarah share this information with Dr. Prescott Gum and proceed with treatment planning accordingly. She knows to call with any questions or concerns in the interval.  Given current concerns for patient exposure during the COVID-19 pandemic, this encounter was conducted via telephone. The patient was notified in advance and was offered a [facility] meeting to allow for face to face communication but unfortunately reported that she did not have the appropriate resources/technology to support such a visit and instead preferred to proceed with telephone consult. The patient has given verbal consent for this type of encounter. The time spent during this encounter was 30 minutes. The attendants for this meeting include Tyler Pita MD, Ashlyn Bruning PA-C, [facility]- scribe, and patient, Sarah Rich. During the encounter, Tyler Pita MD, Ashlyn Bruning PA-C, and scribe, Rae Lips were located at [facility] Radiation Oncology Department.  Patient, DONYALE FALCON was located at home.   Nicholos Johns, PA-C    Tyler Pita, MD  [facility] Direct Dial: [phone]  Fax: [phone] [email or web address]  Skype  [email or web address]  This document serves as a record of services personally performed by Tyler Pita, MD and Freeman Caldron, PA-C. It was created on their behalf by Rae Lips, a trained medical scribe. The creation of this record is based on the scribe's personal observations and the providers' statements to them.  This document has been checked and approved by the attending providers.

## 2019-04-18 ENCOUNTER — Ambulatory Visit
Admission: RE | Admit: 2019-04-18 | Discharge: 2019-04-18 | Disposition: A | Discharge status: Home / Self Care | Payer: Medicare Other | Source: Ambulatory Visit | Attending: Radiation Oncology | Admitting: Radiation Oncology

## 2019-04-18 ENCOUNTER — Other Ambulatory Visit: Payer: Self-pay

## 2019-04-18 DIAGNOSIS — Z510 Encounter for antineoplastic radiation therapy: Secondary | ICD-10-CM | POA: Insufficient documentation

## 2019-04-18 DIAGNOSIS — C3432 Malignant neoplasm of lower lobe, left bronchus or lung: Secondary | ICD-10-CM | POA: Diagnosis not present

## 2019-04-19 NOTE — Progress Notes (Signed)
  Radiation Oncology         [phone] ________________________________  Name: Sarah Rich MRN: [id number]  Date: 04/18/2019  DOB: 1946/08/06  STEREOTACTIC BODY RADIOTHERAPY SIMULATION AND TREATMENT PLANNING NOTE    ICD-10-CM   1. Primary cancer of left lower lobe of lung (HCC)  C34.32     DIAGNOSIS:  73 y.o. female with Stage I, NSCLC in the LLL lung  NARRATIVE:  The patient was brought to the [facility].  Identity was confirmed.  All relevant records and images related to the planned course of therapy were reviewed.  The patient freely provided informed written consent to proceed with treatment after reviewing the details related to the planned course of therapy. The consent form was witnessed and verified by the simulation staff.  Then, the patient was set-up in a stable reproducible  supine position for radiation therapy.  A BodyFix immobilization pillow was fabricated for reproducible positioning.  Then I personally applied the abdominal compression paddle to limit respiratory excursion.  4D respiratoy motion management CT images were obtained.  Surface markings were placed.  The CT images were loaded into the planning software.  Then, using Cine, MIP, and standard views, the internal target volume (ITV) and planning target volumes (PTV) were delinieated, and avoidance structures were contoured.  Treatment planning then occurred.  The radiation prescription was entered and confirmed.  A total of two complex treatment devices were fabricated in the form of the BodyFix immobilization pillow and a neck accuform cushion.  I have requested : 3D Simulation  I have requested a DVH of the following structures: Heart, Lungs, Esophagus, Chest Wall, Brachial Plexus, Major Blood Vessels, and targets.  SPECIAL TREATMENT PROCEDURE:  The planned course of therapy using radiation constitutes a special treatment procedure. Special care is required in the management of this patient for the  following reasons. This treatment constitutes a Special Treatment Procedure for the following reason: [ High dose per fraction requiring special monitoring for increased toxicities of treatment including daily imaging..  The special nature of the planned course of radiotherapy will require increased physician supervision and oversight to ensure patient's safety with optimal treatment outcomes.  RESPIRATORY MOTION MANAGEMENT SIMULATION:  In order to account for effect of respiratory motion on target structures and other organs in the planning and delivery of radiotherapy, this patient underwent respiratory motion management simulation.  To accomplish this, when the patient was brought to the CT simulation planning suite, 4D respiratoy motion management CT images were obtained.  The CT images were loaded into the planning software.  Then, using a variety of tools including Cine, MIP, and standard views, the target volume and planning target volumes (PTV) were delineated.  Avoidance structures were contoured.  Treatment planning then occurred.  Dose volume histograms were generated and reviewed for each of the requested structure.  The resulting plan was carefully reviewed and approved today.  PLAN:  The patient will receive 50 Gy in 5 fractions.  ________________________________  Sarah Rich, M.D.

## 2019-04-24 DIAGNOSIS — Z510 Encounter for antineoplastic radiation therapy: Secondary | ICD-10-CM | POA: Diagnosis not present

## 2019-04-30 ENCOUNTER — Ambulatory Visit
Admission: RE | Admit: 2019-04-30 | Discharge: 2019-04-30 | Disposition: A | Discharge status: Home / Self Care | Payer: Medicare Other | Source: Ambulatory Visit | Attending: Radiation Oncology | Admitting: Radiation Oncology

## 2019-04-30 ENCOUNTER — Other Ambulatory Visit: Payer: Self-pay

## 2019-04-30 DIAGNOSIS — Z510 Encounter for antineoplastic radiation therapy: Secondary | ICD-10-CM | POA: Diagnosis not present

## 2019-05-01 ENCOUNTER — Ambulatory Visit: Payer: Medicare Other | Admitting: Radiation Oncology

## 2019-05-02 ENCOUNTER — Ambulatory Visit
Admission: RE | Admit: 2019-05-02 | Discharge: 2019-05-02 | Disposition: A | Discharge status: Home / Self Care | Payer: Medicare Other | Source: Ambulatory Visit | Attending: Radiation Oncology | Admitting: Radiation Oncology

## 2019-05-02 ENCOUNTER — Other Ambulatory Visit: Payer: Self-pay

## 2019-05-02 DIAGNOSIS — Z510 Encounter for antineoplastic radiation therapy: Secondary | ICD-10-CM | POA: Diagnosis not present

## 2019-05-03 ENCOUNTER — Ambulatory Visit: Payer: Medicare Other | Admitting: Radiation Oncology

## 2019-05-06 ENCOUNTER — Ambulatory Visit
Admission: RE | Admit: 2019-05-06 | Discharge: 2019-05-06 | Disposition: A | Discharge status: Home / Self Care | Payer: Medicare Other | Source: Ambulatory Visit | Attending: Radiation Oncology | Admitting: Radiation Oncology

## 2019-05-06 ENCOUNTER — Other Ambulatory Visit: Payer: Self-pay

## 2019-05-06 DIAGNOSIS — Z510 Encounter for antineoplastic radiation therapy: Secondary | ICD-10-CM | POA: Diagnosis not present

## 2019-05-08 ENCOUNTER — Ambulatory Visit
Admission: RE | Admit: 2019-05-08 | Discharge: 2019-05-08 | Disposition: A | Discharge status: Home / Self Care | Payer: Medicare Other | Source: Ambulatory Visit | Attending: Radiation Oncology | Admitting: Radiation Oncology

## 2019-05-08 ENCOUNTER — Other Ambulatory Visit: Payer: Self-pay

## 2019-05-08 DIAGNOSIS — Z510 Encounter for antineoplastic radiation therapy: Secondary | ICD-10-CM | POA: Diagnosis present

## 2019-05-08 DIAGNOSIS — C3432 Malignant neoplasm of lower lobe, left bronchus or lung: Secondary | ICD-10-CM | POA: Insufficient documentation

## 2019-05-10 ENCOUNTER — Ambulatory Visit
Admission: RE | Admit: 2019-05-10 | Discharge: 2019-05-10 | Disposition: A | Discharge status: Home / Self Care | Payer: Medicare Other | Source: Ambulatory Visit | Attending: Radiation Oncology | Admitting: Radiation Oncology

## 2019-05-10 ENCOUNTER — Other Ambulatory Visit: Payer: Self-pay

## 2019-05-10 ENCOUNTER — Encounter: Payer: Self-pay | Admitting: Radiation Oncology

## 2019-05-10 DIAGNOSIS — Z510 Encounter for antineoplastic radiation therapy: Secondary | ICD-10-CM | POA: Diagnosis not present

## 2019-05-10 NOTE — Progress Notes (Signed)
  Radiation Oncology         [phone] ________________________________  Name: Sarah Rich MRN: [id number]  Date: 05/10/2019  DOB: 11/30/1945  End of Treatment Note  Diagnosis:   73 y.o. female with Stage I, NSCLC in the LLL lung     Indication for treatment:  Curative, Definitive SBRT       Radiation treatment dates:   8/25, 8/27, 8/31, 9/2, 05/10/19  Site/dose:   The target was treated to 50 Gy in 5 fractions of 10 Gy  Beams/energy:   The patient was treated using stereotactic body radiotherapy according to a 3D conformal radiotherapy plan.  Volumetric arc fields were employed to deliver 6 MV X-rays.  Image guidance was performed with per fraction cone beam CT prior to treatment under personal MD supervision.  Immobilization was achieved using BodyFix Pillow.  Narrative: The patient tolerated radiation treatment relatively well.   She did not note any acute complications  Plan: The patient has completed radiation treatment. The patient will return to radiation oncology clinic for routine followup in one month. I advised her to call or return sooner if they have any questions or concerns related to her recovery or treatment. ________________________________  Sheral Apley. Tammi Klippel, M.D.

## 2019-05-29 ENCOUNTER — Encounter: Payer: Self-pay | Admitting: Cardiothoracic Surgery

## 2019-05-29 ENCOUNTER — Ambulatory Visit (INDEPENDENT_AMBULATORY_CARE_PROVIDER_SITE_OTHER): Payer: Medicare Other | Admitting: Cardiothoracic Surgery

## 2019-05-29 ENCOUNTER — Other Ambulatory Visit: Payer: Self-pay

## 2019-05-29 DIAGNOSIS — R911 Solitary pulmonary nodule: Secondary | ICD-10-CM
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
## 2019-06-20 ENCOUNTER — Ambulatory Visit
Admission: RE | Admit: 2019-06-20 | Discharge: 2019-06-20 | Disposition: A | Discharge status: Home / Self Care | Payer: Medicare Other | Source: Ambulatory Visit | Attending: Urology | Admitting: Urology

## 2019-06-20 ENCOUNTER — Other Ambulatory Visit: Payer: Self-pay

## 2019-06-20 ENCOUNTER — Encounter: Payer: Self-pay | Admitting: Urology

## 2019-06-20 DIAGNOSIS — C3432 Malignant neoplasm of lower lobe, left bronchus or lung: Secondary | ICD-10-CM

## 2019-06-20 NOTE — Progress Notes (Signed)
Radiation Oncology         (336) [phone] ________________________________  Name: Sarah Rich MRN: [id number]  Date: 06/20/2019  DOB: [birth year]  Post Treatment Note  CC: Lemmie Evens, MD  Lemmie Evens, MD  Diagnosis:   73 y.o. female with Stage I, NSCLC in the LLL lung   Interval Since Last Radiation:  6 weeks  8/25, 8/27, 8/31, 9/2, 05/10/19:  SBRT// The LLL target was treated to 50 Gy in 5 fractions of 10 Gy  Narrative:  I spoke with the patient to conduct her routine scheduled 1 month follow up visit via telephone to spare the patient unnecessary potential exposure in the healthcare setting during the current COVID-19 pandemic.  The patient was notified in advance and gave permission to proceed with this visit format. She tolerated radiation treatment relatively well and did not note any acute complications.     On review of systems, the patient states that she is doing very well overall.  She denies any chest pain, increased cough, increased shortness of breath or hemoptysis.  She continues using oxygen at 3 L via nasal cannula 24/7.  She has not had recent fever, chills, night sweats or unintentional weight loss.  She reports a healthy appetite and is maintaining her weight.  Overall, she is quite pleased with her progress to date.  ALLERGIES:  is allergic to sulfa antibiotics; codeine; and levaquin [levofloxacin].  Meds: Current Outpatient Medications  Medication Sig Dispense Refill  . acetylcysteine (MUCOMYST) 20 % nebulizer solution Take 3 mLs by nebulization 4 (four) times daily. 30 mL 12  . albuterol (PROVENTIL HFA;VENTOLIN HFA) 108 (90 BASE) MCG/ACT inhaler Inhale 2 puffs into the lungs every 6 (six) hours as needed. Shortness of Breath    . albuterol (PROVENTIL) (2.5 MG/3ML) 0.083% nebulizer solution Take 3 mLs (2.5 mg total) by nebulization 4 (four) times daily. 75 mL 12  . aspirin 81 MG tablet Take 81 mg by mouth daily.    Marland Kitchen atenolol (TENORMIN) 50 MG tablet Take 50  mg by mouth daily.    . cholecalciferol (VITAMIN D) 1000 UNITS tablet Take 1,000 Units by mouth daily.    Marland Kitchen dextromethorphan (DELSYM) 30 MG/5ML liquid Take 10 mLs (60 mg total) by mouth 2 (two) times daily. 89 mL 0  . diazepam (VALIUM) 2 MG tablet Take 2 mg by mouth every 6 (six) hours as needed for anxiety.    . fluticasone (FLONASE) 50 MCG/ACT nasal spray Place 1-2 sprays into both nostrils daily.    . furosemide (LASIX) 40 MG tablet Take 40 mg by mouth daily as needed.  11  . potassium chloride (KLOR-CON) 20 MEQ packet Take 20 mEq by mouth once.    . pravastatin (PRAVACHOL) 40 MG tablet Take 40 mg by mouth daily.    Marland Kitchen SPIRIVA HANDIHALER 18 MCG inhalation capsule Place 18 mcg into inhaler and inhale daily.     . vitamin B-12 (CYANOCOBALAMIN) 1000 MCG tablet Take 1,000 mcg by mouth daily.     No current facility-administered medications for this encounter.     Physical Findings:  vitals were not taken for this visit.   /Unable to assess due to telephone follow-up visit format.  Lab Findings: Lab Results  Component Value Date   WBC 5.8 04/30/2018   HGB 13.2 04/30/2018   HCT 40.9 04/30/2018   MCV 92.1 04/30/2018   PLT 233 04/30/2018     Radiographic Findings: No results found.  Impression/Plan: 1. 73 y.o. female with Stage I,  NSCLC in the LLL lung. She appears to have recovered well from her recent radiotherapy/SBRT to the left lower lobe lung and is currently without complaints.  We discussed the plan to obtain a follow-up, posttreatment CT chest scan in the next 1 to 2 weeks to assess for treatment response and I will call her with those results.  Pending those results are stable, we will plan to move forward with continued serial CT chest scans every 3 to 6 months to monitor for any evidence of disease recurrence or progression, keeping a close eye on the 1.8 cm ground-glass nodule in the right middle lobe.  Fortunately, this lesion was not hypermetabolic on PET from 16/1096. We  will see her back in the office or contact her by telephone following each scan, pending progress made with COVID-19, to discuss the results and recommendations.  She appears to have a good understanding of her disease and our recommendations and is comfortable and in agreement with the stated plan.     Nicholos Johns, PA-C

## 2019-06-21 ENCOUNTER — Telehealth: Payer: Self-pay | Admitting: *Deleted

## 2019-06-21 NOTE — Telephone Encounter (Signed)
CALLED PATIENT TO INFORM OF LAB AND CT FOR 06-26-19- ARRIVAL TIME- 3:15 PM , PT. TO HAVE WATER ONLY - 4 HRS. PRIOR TO TEST, TEST TO BE @ [facility], PATIENT TO RECEIVE PHONE CALL FROM ASHLYN BRUNING ON 06-27-19 @ 10 AM FOR RESULTS, LVM FOR A RETURN CALL

## 2019-06-26 ENCOUNTER — Other Ambulatory Visit: Payer: Self-pay

## 2019-06-26 ENCOUNTER — Other Ambulatory Visit (HOSPITAL_COMMUNITY)
Admission: RE | Admit: 2019-06-26 | Discharge: 2019-06-26 | Disposition: A | Discharge status: Home / Self Care | Payer: Medicare Other | Source: Ambulatory Visit | Attending: Urology | Admitting: Urology

## 2019-06-26 ENCOUNTER — Ambulatory Visit (HOSPITAL_COMMUNITY)
Admission: RE | Admit: 2019-06-26 | Discharge: 2019-06-26 | Disposition: A | Discharge status: Home / Self Care | Payer: Medicare Other | Source: Ambulatory Visit | Attending: Urology | Admitting: Urology

## 2019-06-26 DIAGNOSIS — C3432 Malignant neoplasm of lower lobe, left bronchus or lung: Secondary | ICD-10-CM | POA: Insufficient documentation

## 2019-06-26 LAB — BUN: BUN: 18 mg/dL (ref 8–23)

## 2019-06-26 LAB — CREATININE, SERUM
Creatinine, Ser: 0.97 mg/dL (ref 0.44–1.00)
GFR calc Af Amer: >60 mL/min (ref >60)
GFR calc non Af Amer: 58 mL/min — ABNORMAL LOW (ref >60)

## 2019-06-26 MED ORDER — IOHEXOL 300 MG/ML  SOLN
75.0000 mL | Freq: Once | INTRAMUSCULAR | Status: AC | PRN
  Administered 2019-06-26: 75 mL via INTRAVENOUS

## 2019-06-27 ENCOUNTER — Ambulatory Visit
Admission: RE | Admit: 2019-06-27 | Discharge: 2019-06-27 | Disposition: A | Discharge status: Home / Self Care | Payer: Medicare Other | Source: Ambulatory Visit | Attending: Urology | Admitting: Urology

## 2019-06-27 DIAGNOSIS — C3432 Malignant neoplasm of lower lobe, left bronchus or lung: Secondary | ICD-10-CM

## 2019-06-27 NOTE — Progress Notes (Addendum)
Radiation Oncology         [phone] ________________________________  Name: Sarah Rich MRN: [id number]  Date: 06/27/2019  DOB: 1946/06/12  Post Treatment Note  CC: Lemmie Evens, MD  Lemmie Evens, MD  Diagnosis:   73 y.o. female with Stage I, NSCLC in the LLL lung   Interval Since Last Radiation:  7.5 weeks  8/25, 8/27, 8/31, 9/2, 05/10/19:  SBRT// The LLL target was treated to 50 Gy in 5 fractions of 10 Gy  Narrative:  I spoke with the patient to discuss her recent follow up CT Chest results via telephone to spare the patient unnecessary potential exposure in the healthcare setting during the current COVID-19 pandemic.  The patient was notified in advance and gave permission to proceed with this visit format. She tolerated her recent radiation treatment relatively well and did not note any acute complications.     On review of systems, the patient states that she is doing very well overall.  She denies any chest pain, increased cough, increased shortness of breath or hemoptysis.  She continues with shortness of breath with activities associated with her underlying COPD and is using oxygen at 3 L via nasal cannula 24/7.  She reports that the shortness of breath is becoming a nuisance and interfering with her ability to get back into her normal routine of exercising now that her energy level has improved.  She has not had recent fever, chills, night sweats or unintentional weight loss.  She reports a healthy appetite and is maintaining her weight.  Overall, she remains quite pleased with her progress to date.  ALLERGIES:  is allergic to sulfa antibiotics; codeine; and levaquin [levofloxacin].  Meds: Current Outpatient Medications  Medication Sig Dispense Refill  . acetylcysteine (MUCOMYST) 20 % nebulizer solution Take 3 mLs by nebulization 4 (four) times daily. 30 mL 12  . albuterol (PROVENTIL HFA;VENTOLIN HFA) 108 (90 BASE) MCG/ACT inhaler Inhale 2 puffs into the lungs every 6  (six) hours as needed. Shortness of Breath    . albuterol (PROVENTIL) (2.5 MG/3ML) 0.083% nebulizer solution Take 3 mLs (2.5 mg total) by nebulization 4 (four) times daily. 75 mL 12  . aspirin 81 MG tablet Take 81 mg by mouth daily.    Marland Kitchen atenolol (TENORMIN) 50 MG tablet Take 50 mg by mouth daily.    . cholecalciferol (VITAMIN D) 1000 UNITS tablet Take 1,000 Units by mouth daily.    Marland Kitchen dextromethorphan (DELSYM) 30 MG/5ML liquid Take 10 mLs (60 mg total) by mouth 2 (two) times daily. 89 mL 0  . diazepam (VALIUM) 2 MG tablet Take 2 mg by mouth every 6 (six) hours as needed for anxiety.    . fluticasone (FLONASE) 50 MCG/ACT nasal spray Place 1-2 sprays into both nostrils daily.    . furosemide (LASIX) 40 MG tablet Take 40 mg by mouth daily as needed.  11  . potassium chloride (KLOR-CON) 20 MEQ packet Take 20 mEq by mouth once.    . pravastatin (PRAVACHOL) 40 MG tablet Take 40 mg by mouth daily.    Marland Kitchen SPIRIVA HANDIHALER 18 MCG inhalation capsule Place 18 mcg into inhaler and inhale daily.     . vitamin B-12 (CYANOCOBALAMIN) 1000 MCG tablet Take 1,000 mcg by mouth daily.     No current facility-administered medications for this encounter.     Physical Findings:  vitals were not taken for this visit.   /Unable to assess due to telephone follow-up visit format.  Lab Findings: Lab Results  Component Value Date   WBC 5.8 04/30/2018   HGB 13.2 04/30/2018   HCT 40.9 04/30/2018   MCV 92.1 04/30/2018   PLT 233 04/30/2018     Radiographic Findings: Ct Chest W Contrast  Result Date: 06/26/2019 CLINICAL DATA:  Non-small cell lung cancer follow-up status post SBRT to left lower lobe nodule, completed radiation therapy in September of 2020 EXAM: CT CHEST WITH CONTRAST TECHNIQUE: Multidetector CT imaging of the chest was performed during intravenous contrast administration. CONTRAST:  104mL OMNIPAQUE IOHEXOL 300 MG/ML  SOLN COMPARISON:  11/13/2018 FINDINGS: Cardiovascular: Signs of aortic  atherosclerosis, moderate to marked, stable compared to prior study. Central pulmonary arteries are unremarkable. Heart size stable and normal with small pericardial effusion. Mediastinum/Nodes: No signs of adenopathy in the chest. Lungs/Pleura: Signs of emphysema. Similar prior study. Left lower lobe nodule measures 19 x 13 mm along the pericardium in the medial left chest previously approximately 17 mm in greatest axial dimension no signs of new suspicious nodule. Airways are patent. Subtle ground-glass in the anterior right chest in the right middle lobe is unchanged measuring approximately 17 x 14 mm. Upper Abdomen: Subtle low-density focus in the lateral section of the left hepatic lobe displays peripheral, nodular and discontinuous enhancement and measures approximately 13 by 13 mm. The cyst in the medial segment left hepatic lobe. Liver is incompletely imaged. Areas of low attenuation in the bilateral kidneys are similar. Slight enlargement of presumed partially proteinaceous cyst arises from the medial left kidney extending inferiorly. Adrenal glands are normal. Musculoskeletal: Spinal degenerative changes without acute or destructive bone process. IMPRESSION: Left lower lobe nodule within 1 mm of prior measurement. Stable ground-glass in the right middle lobe. Again, slow growing adenocarcinoma with lepidic growth pattern is considered. Hepatic hemangioma. Slight enlargement of a presumed partially proteinaceous cyst arises from the medial left kidney. Recommend attention on follow-up. Aortic Atherosclerosis (ICD10-I70.0) and Emphysema (ICD10-J43.9). Electronically Signed   By: Zetta Bills M.D.   On: 06/26/2019 16:24    Impression/Plan: 1. 73 y.o. 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 She appears to have recovered well from her recent radiotherapy/SBRT to the left lower lobe lung and is currently without complaints.  We reviewed the results from her recent post-treatment CT Chest which shows a  stable appearance of the recently treated LLL nodule and no progression or change in the ground glass nodule in the RML.  We will plan to move forward with continued serial CT chest scans every 3 to 6 months to monitor for any evidence of disease recurrence or progression, keeping a close eye on the 1.8 cm ground-glass nodule in the right middle lobe.  Fortunately, this lesion was not hypermetabolic on PET from 23/7628. We will see her back in the office or contact her by telephone following each scan, pending progress made with COVID-19, to discuss the results and recommendations.  She appears to have a good understanding of her disease and our recommendations and is comfortable and in agreement with the stated plan.   2. COPD.  Concerning her persistent shortness of breath with activity, I offered to make a referral to one of the pulmonologist here in [place] since her current pulmonologist, Dr. Luan Pulling, is planning to retire in the near future.  At this point, she wishes to hold off and instead prefers to continue to attempt to gradually increase her activity levels to see if her breathing improves with a little time.  She knows to call us immediately should her  symptoms worsen or should she develop any concerning symptoms such as chest pain, hemoptysis, fever, etc.  She states her understanding and compliance.  I will send a copy of today's note over to Dr. Luan Pulling and her PCP, Dr. Karie Kirks.    Nicholos Johns, PA-C

## 2019-06-27 NOTE — Addendum Note (Signed)
Encounter addended by: Freeman Caldron, PA-C on: 06/27/2019 4:31 PM  Actions taken: Clinical Note Signed

## 2019-09-03 ENCOUNTER — Ambulatory Visit: Payer: Medicare Other | Attending: Internal Medicine

## 2019-09-03 ENCOUNTER — Other Ambulatory Visit: Payer: Self-pay

## 2019-09-03 DIAGNOSIS — Z20822 Contact with and (suspected) exposure to covid-19: Secondary | ICD-10-CM

## 2019-09-04 LAB — NOVEL CORONAVIRUS, NAA: SARS-CoV-2, NAA: NOT DETECTED

## 2019-09-25 ENCOUNTER — Telehealth: Payer: Self-pay | Admitting: *Deleted

## 2019-09-25 ENCOUNTER — Telehealth: Payer: Self-pay | Admitting: Radiation Oncology

## 2019-09-25 NOTE — Telephone Encounter (Signed)
Received voicemail message from patient requesting to change lab and CT appointment to [place] in the same day. Forwarded request onto [employer], scheduler. Enid Derry will contact this patient.

## 2019-09-25 NOTE — Telephone Encounter (Signed)
Called patient to inform of lab and CT for 10-08-19 - arrival time- 12:45 pm @ [facility] , stat Labs, then  CT to be done, pt. to have water only- 4 hrs. Prior to test, patient to receive results on 10-09-19 @ 3 pm from [employer], patient verified understanding all these appts.

## 2019-09-27 ENCOUNTER — Ambulatory Visit (HOSPITAL_COMMUNITY): Payer: Medicare Other

## 2019-09-27 ENCOUNTER — Ambulatory Visit: Payer: Medicare Other

## 2019-09-27 ENCOUNTER — Telehealth: Payer: Self-pay | Admitting: Radiation Oncology

## 2019-09-27 NOTE — Telephone Encounter (Signed)
Received voicemail message from [place] at Dr. Remo Lipps [place]'s office in [place]. Clarise Cruz verbalized on her message that Dr. Joen Laura requests we coordinate our labs with his so the patient only has to get stuck once. She goes onto say Dr. Joen Laura is requesting a CBC, CMP and lipid panel. Knowing this patient phoned this week requesting the labs and scans we required of her to be done in [place] I am forwarding this onto our scheduler, Romie Jumper.

## 2019-10-01 ENCOUNTER — Other Ambulatory Visit (HOSPITAL_COMMUNITY)
Admission: RE | Admit: 2019-10-01 | Discharge: 2019-10-01 | Disposition: A | Discharge status: Home / Self Care | Payer: Medicare Other | Source: Ambulatory Visit | Attending: Urology | Admitting: Urology

## 2019-10-01 ENCOUNTER — Ambulatory Visit (HOSPITAL_COMMUNITY)
Admission: RE | Admit: 2019-10-01 | Discharge: 2019-10-01 | Disposition: A | Discharge status: Home / Self Care | Payer: Medicare Other | Source: Ambulatory Visit | Attending: Urology | Admitting: Urology

## 2019-10-01 ENCOUNTER — Other Ambulatory Visit: Payer: Self-pay

## 2019-10-01 DIAGNOSIS — C3432 Malignant neoplasm of lower lobe, left bronchus or lung: Secondary | ICD-10-CM | POA: Insufficient documentation

## 2019-10-01 LAB — CREATININE, SERUM
Creatinine, Ser: 0.85 mg/dL (ref 0.44–1.00)
GFR calc Af Amer: >60 mL/min (ref >60)
GFR calc non Af Amer: >60 mL/min (ref >60)

## 2019-10-01 LAB — BUN: BUN: 17 mg/dL (ref 8–23)

## 2019-10-01 MED ORDER — IOHEXOL 300 MG/ML  SOLN
75.0000 mL | Freq: Once | INTRAMUSCULAR | Status: AC | PRN
  Administered 2019-10-01: 14:00:00 75 mL via INTRAVENOUS

## 2019-10-02 ENCOUNTER — Ambulatory Visit: Payer: Self-pay | Admitting: Urology

## 2019-10-08 ENCOUNTER — Ambulatory Visit (HOSPITAL_COMMUNITY): Payer: Medicare Other

## 2019-10-09 ENCOUNTER — Other Ambulatory Visit: Payer: Self-pay

## 2019-10-09 ENCOUNTER — Ambulatory Visit
Admission: RE | Admit: 2019-10-09 | Discharge: 2019-10-09 | Disposition: A | Discharge status: Home / Self Care | Payer: Medicare Other | Source: Ambulatory Visit | Attending: Urology | Admitting: Urology

## 2019-10-09 DIAGNOSIS — C3432 Malignant neoplasm of lower lobe, left bronchus or lung: Secondary | ICD-10-CM

## 2019-10-09 NOTE — Progress Notes (Signed)
Radiation Oncology         [phone] ________________________________  Name: Sarah Rich MRN: [id number]  Date: 10/09/2019  DOB: 1946/02/25  Post Treatment Note  CC: Lemmie Evens, MD  Lemmie Evens, MD  Diagnosis:   74 y.o. female with Stage I, NSCLC in the LLL lung   Interval Since Last Radiation:  5 months  8/25, 8/27, 8/31, 9/2, 05/10/19:  SBRT// The LLL target was treated to 50 Gy in 5 fractions of 10 Gy  Narrative:  I spoke with the patient to discuss her recent follow up CT Chest results via telephone to spare the patient unnecessary potential exposure in the healthcare setting during the current COVID-19 pandemic.  The patient was notified in advance and gave permission to proceed with this visit format. She tolerated her radiation treatment relatively well and did not note any acute complications.   Today's visit is to review the results from her follow up CT Chest performed on 10/01/19 which shows stability of the treated LLL nodule as well as the ground-glass nodule in the RML. There is no lymphadenopathy or other findings to suggest disease progression or recurrence.  On review of systems, the patient states that she is doing very well overall.  She denies any chest pain, increased cough, increased shortness of breath or hemoptysis.  She continues with shortness of breath with activities associated with her underlying COPD, unchanged recently and is using oxygen at 3 L via nasal cannula 24/7.  She has not had recent fever, chills, night sweats or unintentional weight loss.  She reports a healthy appetite and is maintaining her weight.  Overall, she remains quite pleased with her progress to date.  ALLERGIES:  is allergic to sulfa antibiotics; codeine; and levaquin [levofloxacin].  Meds: Current Outpatient Medications  Medication Sig Dispense Refill  . acetylcysteine (MUCOMYST) 20 % nebulizer solution Take 3 mLs by nebulization 4 (four) times daily. 30 mL 12  . albuterol  (PROVENTIL HFA;VENTOLIN HFA) 108 (90 BASE) MCG/ACT inhaler Inhale 2 puffs into the lungs every 6 (six) hours as needed. Shortness of Breath    . albuterol (PROVENTIL) (2.5 MG/3ML) 0.083% nebulizer solution Take 3 mLs (2.5 mg total) by nebulization 4 (four) times daily. 75 mL 12  . aspirin 81 MG tablet Take 81 mg by mouth daily.    Marland Kitchen atenolol (TENORMIN) 50 MG tablet Take 50 mg by mouth daily.    . cholecalciferol (VITAMIN D) 1000 UNITS tablet Take 1,000 Units by mouth daily.    Marland Kitchen dextromethorphan (DELSYM) 30 MG/5ML liquid Take 10 mLs (60 mg total) by mouth 2 (two) times daily. 89 mL 0  . diazepam (VALIUM) 2 MG tablet Take 2 mg by mouth every 6 (six) hours as needed for anxiety.    . fluticasone (FLONASE) 50 MCG/ACT nasal spray Place 1-2 sprays into both nostrils daily.    . furosemide (LASIX) 40 MG tablet Take 40 mg by mouth daily as needed.  11  . potassium chloride (KLOR-CON) 20 MEQ packet Take 20 mEq by mouth once.    . pravastatin (PRAVACHOL) 40 MG tablet Take 40 mg by mouth daily.    Marland Kitchen SPIRIVA HANDIHALER 18 MCG inhalation capsule Place 18 mcg into inhaler and inhale daily.     . vitamin B-12 (CYANOCOBALAMIN) 1000 MCG tablet Take 1,000 mcg by mouth daily.     No current facility-administered medications for this encounter.    Physical Findings:  vitals were not taken for this visit.   Karen Kays to  assess due to telephone follow-up visit format.  Lab Findings: Lab Results  Component Value Date   WBC 5.8 04/30/2018   HGB 13.2 04/30/2018   HCT 40.9 04/30/2018   MCV 92.1 04/30/2018   PLT 233 04/30/2018     Radiographic Findings: CT CHEST W CONTRAST  Result Date: 10/01/2019 CLINICAL DATA:  Small cell lung cancer. Follow-up LEFT lower lobe lung cancer. Radiation therapy 2020. EXAM: CT CHEST WITH CONTRAST TECHNIQUE: Multidetector CT imaging of the chest was performed during intravenous contrast administration. CONTRAST:  23mL OMNIPAQUE IOHEXOL 300 MG/ML  SOLN COMPARISON:  Chest CT  06/26/2019 FINDINGS: Cardiovascular: Coronary artery calcification and aortic atherosclerotic calcification. Mediastinum/Nodes: No axillary or supraclavicular adenopathy. No mediastinal or hilar adenopathy. No pericardial fluid. Esophagus normal. Lungs/Pleura: Within the LEFT lower lobe, nodule abutting the pericardial surface is again demonstrated measuring 16 mm x 15 mm (image 125/2) compared to 16 mm x 16 mm on CT 01/13/2019 and from 19 mm x 13 mm on most recent exam from 06/26/2019. Lungs are hyperinflated.  No suspicious nodules. Ground-glass nodule in the RIGHT middle lobe measuring 1.5 by 1.4 cm (image 118/4) is unchanged Upper Abdomen: Limited view of the liver, kidneys, pancreas are unremarkable. Normal adrenal glands. Musculoskeletal: No chest wall abnormality. No acute or significant osseous findings. IMPRESSION: 1. Stable nodule in the medial LEFT lower lobe. 2. Stable ground-glass nodule in the RIGHT middle lobe. 3. Recommend continued surveillance. 4. Hyperinflated lungs. 5. Coronary artery calcification and Aortic Atherosclerosis (ICD10-I70.0). Electronically Signed   By: Suzy Bouchard M.D.   On: 10/01/2019 15:13    Impression/Plan: 1. 74 y.o. 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 She appears to have recovered well from her recent radiotherapy/SBRT to the left lower lobe lung and is currently without complaints.  We reviewed the results from her recent post-treatment CT Chest which shows a stable appearance of the recently treated LLL nodule and no progression or change in the ground glass nodule in the RML.  We will plan to move forward with continued serial CT chest scans every 6 months to monitor for any evidence of disease recurrence or progression, keeping a close eye on the 1.8 cm ground-glass nodule in the right middle lobe.  Fortunately, this lesion was not hypermetabolic on PET from 19/5093. We will see her back in the office or contact her by telephone following each scan,  pending progress made with COVID-19, to discuss the results and recommendations.  She appears to have a good understanding of her disease and our recommendations and is comfortable and in agreement with the stated plan.   2. COPD.  Concerning her persistent shortness of breath with activity, I offered to make a referral to one of the pulmonologists here in [place] since her pulmonologist, Dr. Luan Pulling, has recently retired.  At this point, she wishes to hold off and instead prefers to continue to gradually increase her activity levels to see if her breathing improves with a little time as it seems to be doing so at this point.  She knows to call us immediately should her symptoms worsen or should she develop any concerning symptoms such as chest pain, hemoptysis, fever, etc.  She states her understanding and compliance.  I will send a copy of today's note along with her labs and CT results over to her PCP, Dr. Karie Kirks.  Given current concerns for patient exposure during the COVID-19 pandemic, this encounter was conducted via telephone. The patient was notified in advance and was  offered a [place] meeting to allow for face to face communication but unfortunately reported that he/she did not have the appropriate resources/technology to support such a visit and instead preferred to proceed with telephone consult. The patient has given verbal consent for this type of encounter. The time spent during this encounter was 20 minutes. The attendants for this meeting include Samit Sylve PA-C, and patient, Sarah Rich. During the encounter,  Malillany Kazlauskas PA-C, was located at [facility] Radiation Oncology Department.  Patient, Sarah Rich, was located at home.    Nicholos Johns, PA-C

## 2020-01-30 ENCOUNTER — Ambulatory Visit (INDEPENDENT_AMBULATORY_CARE_PROVIDER_SITE_OTHER): Payer: Medicare Other | Admitting: Pulmonary Disease

## 2020-01-30 ENCOUNTER — Other Ambulatory Visit: Payer: Self-pay

## 2020-01-30 ENCOUNTER — Encounter: Payer: Self-pay | Admitting: Pulmonary Disease

## 2020-01-30 DIAGNOSIS — C3432 Malignant neoplasm of lower lobe, left bronchus or lung: Secondary | ICD-10-CM

## 2020-01-30 DIAGNOSIS — J9611 Chronic respiratory failure with hypoxia: Secondary | ICD-10-CM | POA: Diagnosis not present

## 2020-01-30 DIAGNOSIS — F17200 Nicotine dependence, unspecified, uncomplicated: Secondary | ICD-10-CM

## 2020-01-30 DIAGNOSIS — J449 Chronic obstructive pulmonary disease, unspecified: Secondary | ICD-10-CM

## 2020-01-30 MED ORDER — TRELEGY ELLIPTA 100-62.5-25 MCG/INH IN AEPB: 1.0000 | INHALATION_SPRAY | Freq: Every day | RESPIRATORY_TRACT | 0 refills | Status: DC

## 2020-01-30 NOTE — Assessment & Plan Note (Signed)
54-month surveillance CT planned for July 2021

## 2020-01-30 NOTE — Patient Instructions (Signed)
Sample of trelegy 100 - 1 puff daily , rinse mouth after use Take instead of spiriva, call me for Rx if this works  Use albuterol MDI 2 puffs every 6h as needed  Stay on oxygen
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
## 2020-01-30 NOTE — Assessment & Plan Note (Signed)
Smoking cessation again emphasized strongly

## 2020-01-30 NOTE — Assessment & Plan Note (Signed)
Continue oxygen at current levels 3 L at home when 2 to 3 L pulse on ambulation -She also does seem to have hypercarbia, would classify as end-stage COPD

## 2020-01-30 NOTE — Assessment & Plan Note (Signed)
Sample of trelegy 100 - 1 puff daily , rinse mouth after use Take instead of spiriva, call me for Rx if this works  Use albuterol MDI 2 puffs every 6h as needed  We discussed signs and symptoms of COPD exacerbation she will call me should this occur

## 2020-03-25 ENCOUNTER — Telehealth: Payer: Self-pay | Admitting: *Deleted

## 2020-03-25 NOTE — Telephone Encounter (Signed)
CALLED PATIENT TO INFORM OF CT FOR 04-15-20 - ARRIVAL TIME- 3:45 PM @ [facility], PATIENT TO HAVE WATER ONLY - 4 HRS. PRIOR TO TEST, PATIENT TO RECEIVE CT RESULTS FROM ASHLYN BRUNING ON 04-16-20 @ 1:30 PM, VIA TELEPHONE, PATIENT VERIFIED UNDERSTANDING THESE APPTS.

## 2020-04-08 ENCOUNTER — Ambulatory Visit: Payer: Self-pay | Admitting: Urology

## 2020-04-13 ENCOUNTER — Telehealth: Payer: Self-pay

## 2020-04-13 ENCOUNTER — Encounter: Payer: Self-pay | Admitting: Urology

## 2020-04-13 NOTE — Progress Notes (Signed)
Patient has telephone visit with Ashlyn Bruning PA for CT scan results. Patient states left shoulder blade pain that is sharp when taking deep breath. Patient states that the pain is so sharp sometimes that it takes your breath away. Patient states that the pain is a 10/10 and does not feel like it is the bone. Patient states that the pain comes and goes.Patient states that she is currently taking Aleve to help with the pain.

## 2020-04-13 NOTE — Telephone Encounter (Signed)
Spoke with patient in regards to telephone appointment with Freeman Caldron PA on 04/16/20 at 2:00pm. Patient verbalized understanding of appointment date and time. Reviewed meaningful use questions. TM

## 2020-04-15 ENCOUNTER — Other Ambulatory Visit: Payer: Self-pay

## 2020-04-15 ENCOUNTER — Ambulatory Visit (HOSPITAL_COMMUNITY)
Admission: RE | Admit: 2020-04-15 | Discharge: 2020-04-15 | Disposition: A | Discharge status: Home / Self Care | Payer: Medicare Other | Source: Ambulatory Visit | Attending: Urology | Admitting: Urology

## 2020-04-15 DIAGNOSIS — C3432 Malignant neoplasm of lower lobe, left bronchus or lung: Secondary | ICD-10-CM

## 2020-04-15 LAB — POCT I-STAT CREATININE: Creatinine, Ser: 0.9 mg/dL (ref 0.44–1.00)

## 2020-04-15 MED ORDER — IOHEXOL 300 MG/ML  SOLN
75.0000 mL | Freq: Once | INTRAMUSCULAR | Status: AC | PRN
  Administered 2020-04-15: 75 mL via INTRAVENOUS

## 2020-04-16 ENCOUNTER — Ambulatory Visit
Admission: RE | Admit: 2020-04-16 | Discharge: 2020-04-16 | Disposition: A | Discharge status: Home / Self Care | Payer: Medicare Other | Source: Ambulatory Visit | Attending: Urology | Admitting: Urology

## 2020-04-16 DIAGNOSIS — C3432 Malignant neoplasm of lower lobe, left bronchus or lung: Secondary | ICD-10-CM

## 2020-04-17 ENCOUNTER — Other Ambulatory Visit: Payer: Self-pay | Admitting: Urology

## 2020-04-17 DIAGNOSIS — C3432 Malignant neoplasm of lower lobe, left bronchus or lung: Secondary | ICD-10-CM

## 2020-04-17 NOTE — Progress Notes (Signed)
Radiation Oncology         [phone] ________________________________  Name: Sarah Rich MRN: [id number]  Date: 04/16/2020  DOB: 02-09-1946  Post Treatment Note  CC: Lemmie Evens, MD  Lemmie Evens, MD  Diagnosis:   74 y.o. female with Stage I, NSCLC in the LLL lung   Interval Since Last Radiation:  11 months  8/25, 8/27, 8/31, 9/2, 05/10/19:  SBRT// The LLL target was treated to 50 Gy in 5 fractions of 10 Gy  Narrative:  I spoke with the patient to discuss her recent follow up CT Chest results via telephone to spare the patient unnecessary potential exposure in the healthcare setting during the current COVID-19 pandemic.  The patient was notified in advance and gave permission to proceed with this visit format. She has recovered well from her recent radiotherapy and remains without acute complaints.   Today's visit is to review the results from her follow up CT Chest performed on 04/15/20 which shows stability of the treated LLL nodule as well as the ground-glass nodule in the RML. There is no lymphadenopathy or other findings to suggest disease progression or recurrence.  On review of systems, the patient states that she is doing very well overall.  She denies any chest pain, increased cough, increased shortness of breath or hemoptysis.  She continues with shortness of breath with activities associated with her underlying COPD, unchanged recently and is using oxygen at 3 L via nasal cannula 24/7.  She has not had recent fever, chills, night sweats or unintentional weight loss.  She reports a healthy appetite and is maintaining her weight.  Her COPD continues to be followed and managed by Dr. Elsworth Soho.  Overall, she remains quite pleased with her progress to date.  ALLERGIES:  is allergic to sulfa antibiotics, codeine, and levaquin [levofloxacin].  Meds: Current Outpatient Medications  Medication Sig Dispense Refill  . acetylcysteine (MUCOMYST) 20 % nebulizer solution Take 3 mLs by  nebulization 4 (four) times daily. 30 mL 12  . albuterol (PROVENTIL HFA;VENTOLIN HFA) 108 (90 BASE) MCG/ACT inhaler Inhale 2 puffs into the lungs every 6 (six) hours as needed. Shortness of Breath    . albuterol (PROVENTIL) (2.5 MG/3ML) 0.083% nebulizer solution Take 3 mLs (2.5 mg total) by nebulization 4 (four) times daily. 75 mL 12  . aspirin 81 MG tablet Take 81 mg by mouth daily.    Marland Kitchen atenolol (TENORMIN) 50 MG tablet Take 50 mg by mouth daily.    . cholecalciferol (VITAMIN D) 1000 UNITS tablet Take 1,000 Units by mouth daily.    . diazepam (VALIUM) 2 MG tablet Take 2 mg by mouth every 6 (six) hours as needed for anxiety.    . fluticasone (FLONASE) 50 MCG/ACT nasal spray Place 1-2 sprays into both nostrils daily.    . Fluticasone-Umeclidin-Vilant (TRELEGY ELLIPTA) 100-62.5-25 MCG/INH AEPB Inhale 1 puff into the lungs daily. 28 each 0  . furosemide (LASIX) 40 MG tablet Take 40 mg by mouth daily as needed.  11  . potassium chloride (KLOR-CON) 20 MEQ packet Take 20 mEq by mouth once.    . pravastatin (PRAVACHOL) 40 MG tablet Take 40 mg by mouth daily.    Marland Kitchen SPIRIVA HANDIHALER 18 MCG inhalation capsule Place 18 mcg into inhaler and inhale daily.     . vitamin B-12 (CYANOCOBALAMIN) 1000 MCG tablet Take 1,000 mcg by mouth daily.     No current facility-administered medications for this encounter.    Physical Findings:  vitals were not taken  for this visit.   /Unable to assess due to telephone follow-up visit format.  Lab Findings: Lab Results  Component Value Date   WBC 5.8 04/30/2018   HGB 13.2 04/30/2018   HCT 40.9 04/30/2018   MCV 92.1 04/30/2018   PLT 233 04/30/2018     Radiographic Findings: CT Chest W Contrast  Result Date: 04/15/2020 CLINICAL DATA:  Left lower lobe lung cancer, status post radiation in 2020 EXAM: CT CHEST WITH CONTRAST TECHNIQUE: Multidetector CT imaging of the chest was performed during intravenous contrast administration. CONTRAST:  56mL OMNIPAQUE IOHEXOL 300  MG/ML  SOLN COMPARISON:  10/01/2019 FINDINGS: Cardiovascular: The heart is normal in size. No pericardial effusion. No evidence of thoracic aortic aneurysm. Atherosclerotic calcifications of the aortic arch. Coronary atherosclerosis of the LAD and right coronary artery. Mediastinum/Nodes: No suspicious mediastinal lymphadenopathy. Visualized thyroid is unremarkable. Lungs/Pleura: Mild patchy ground-glass opacities in the right lung, most prominent in the lateral right lower lobe (series 4/image 130), suggesting mild infection/pneumonia. 12 x 19 mm nodule in the medial left lower lobe (series 4/image 117), previously 16 x 15 mm, unchanged. Additional 4 mm lingular nodule (series 4/image 115). Moderate centrilobular and paraseptal emphysematous changes, upper lung predominant. Mild biapical pleural-parenchymal scarring. No pleural effusion or pneumothorax. Upper Abdomen: Visualized upper abdomen is grossly unremarkable, noting vascular calcifications. Musculoskeletal: Degenerative changes of the visualized thoracolumbar spine. IMPRESSION: Mild patchy right lung opacities, most prominent in the left lower lobe, suggesting mild infection/pneumonia. Stable medial left lower lobe nodule. Aortic Atherosclerosis (ICD10-I70.0) and Emphysema (ICD10-J43.9). Electronically Signed   By: Julian Hy M.D.   On: 04/15/2020 16:43    Impression/Plan: 1. 74 y.o. 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 She appears to have recovered well from her recent radiotherapy/SBRT to the left lower lobe lung and is currently without complaints.  We reviewed the results from her recent CT Chest which shows a stable appearance of the recently treated LLL nodule and no progression or change in the ground glass nodule in the RML.  We will plan to continue with serial CT chest scans every 6 months to monitor for any evidence of disease recurrence or progression, keeping a close eye on the 1.8 cm ground-glass nodule in the right middle  lobe.  Fortunately, this lesion was not hypermetabolic on PET from 50/0938. We will see her back in the office or contact her by telephone following each scan, pending progress made with COVID-19, to discuss the results and recommendations.  She appears to have a good understanding of her disease and our recommendations and is comfortable and in agreement with the stated plan.   2. COPD.  She has recently transferred her care to Dr. Elsworth Soho upon the retirement of her previous pulmonologist, Dr. Luan Pulling.  Her most recent visit with Dr. Elsworth Soho was 01/30/2020 with plans for a follow-up visit in 6 months.  Given current concerns for patient exposure during the COVID-19 pandemic, this encounter was conducted via telephone. The patient was notified in advance and was offered a [place] meeting to allow for face to face communication but unfortunately reported that he/she did not have the appropriate resources/technology to support such a visit and instead preferred to proceed with telephone consult. The patient has given verbal consent for this type of encounter. The time spent during this encounter was 20 minutes. The attendants for this meeting include Bridget  PA-C, and patient, Sarah Rich. During the encounter,  Deneen Slager PA-C, was located at [facility].  Patient, Sarah Rich, was located at home.    Nicholos Johns, PA-C

## 2020-05-05 ENCOUNTER — Ambulatory Visit (INDEPENDENT_AMBULATORY_CARE_PROVIDER_SITE_OTHER): Payer: Medicare Other | Admitting: Pulmonary Disease

## 2020-05-05 ENCOUNTER — Other Ambulatory Visit: Payer: Self-pay

## 2020-05-05 ENCOUNTER — Encounter: Payer: Self-pay | Admitting: Pulmonary Disease

## 2020-05-05 DIAGNOSIS — R911 Solitary pulmonary nodule: Secondary | ICD-10-CM

## 2020-05-05 DIAGNOSIS — J432 Centrilobular emphysema: Secondary | ICD-10-CM

## 2020-05-05 DIAGNOSIS — J9611 Chronic respiratory failure with hypoxia: Secondary | ICD-10-CM | POA: Diagnosis not present

## 2020-05-05 DIAGNOSIS — F17200 Nicotine dependence, unspecified, uncomplicated: Secondary | ICD-10-CM

## 2020-05-05 MED ORDER — TRELEGY ELLIPTA 100-62.5-25 MCG/INH IN AEPB
1.0000 | INHALATION_SPRAY | Freq: Every day | RESPIRATORY_TRACT | 5 refills | Status: DC
Start: 2020-05-05 — End: 2020-11-12

## 2020-05-05 NOTE — Assessment & Plan Note (Signed)
Smoking cessation again emphasized is the most important intervention that would add years to life

## 2020-05-05 NOTE — Progress Notes (Signed)
   Subjective:    Patient ID: Sarah Rich, female    DOB: 1946-09-04, 74 y.o.   MRN: [id number]  HPI  74 yo smoker for FU of  severe COPD and hypoxic respiratory failure  She had an enlarging left lower lobe hypermetabolic nodule, adjacent to pericardium and underwent SBRT 05/2019, felt to be too high risk to biopsy and not a candidate for VATS   She is on oxygen since her hospitalization in 2019, uses 3 L at home and 2 L pulse on her POC. She continues to smoke 3 to 4 cigarettes/day.  Up-to-date with Covid vaccination and inquires about booster shot.  On her last visit, we gave her sample of Trelegy and this finally worked better than Spiriva but she is worried about cost  We reviewed CT chest   Significant tests/ events reviewed CT chest 04/2020 left lower lobe nodule abutting the pericardium/pleura stable, additional 4 mm lingular nodule -Patchy groundglass opacities in the right lower lobe  CT chest 09/2019 >>Within the LEFT lower lobe, nodule abutting the pericardial surface is again demonstrated measuring 16 mm x 15 mm compared to 16 mm x 16 mm on CT 01/13/2019 and from 19 mm x 13 mm on most recent exam from 06/26/2019.  -Ground-glass nodule in the RIGHT middle lobe 1.5 by 1.4 cm is unchanged  PFTs 03/2019 severe airway obstruction, ratio 30, FEV1 0.54/20%, FVC 53%, DLCO 35% ABG 10/2015 7.3 2/54/322 100%  Review of Systems  Patient denies significant dyspnea,cough, hemoptysis,  chest pain, palpitations, pedal edema, orthopnea, paroxysmal nocturnal dyspnea, lightheadedness, nausea, vomiting, abdominal or  leg pains      Objective:   Physical Exam  Gen. Pleasant, thin, in no distress ENT - no thrush, no pallor/icterus,no post nasal drip Neck: No JVD, no thyromegaly, no carotid bruits Lungs: no use of accessory muscles, no dullness to percussion, clear without rales or rhonchi  Cardiovascular: Rhythm regular, heart sounds  normal, no murmurs or gallops, no peripheral  edema Musculoskeletal: No deformities, no cyanosis or clubbing         Assessment & Plan:

## 2020-05-05 NOTE — Assessment & Plan Note (Signed)
Continue 2 L POC

## 2020-05-05 NOTE — Patient Instructions (Signed)
Prescription for Trelegy 100 will be sent to your pharmacy with 5 refills.  If this is too expensive, alternative medication would be Sarah Rich You can stop taking Spiriva once you get on 1 of these medications  Repeat CT chest without contrast in February 2022  Call us for signs or symptoms of bronchitis.  Flu shot and Covid booster shot would be recommended

## 2020-05-05 NOTE — Assessment & Plan Note (Signed)
Prescription for Trelegy 100 will be sent to your pharmacy with 5 refills.  If this is too expensive, alternative medication would be [employer] can stop taking Spiriva once you get on 1 of these medications  Call us for signs or symptoms of bronchitis.  Flu shot and Covid booster shot would be recommended

## 2020-05-05 NOTE — Assessment & Plan Note (Addendum)
Previous nodule is stable status post SBRT but concerned because this is abutting the pleura/pericardium. Not sure what to make of the patchy groundglass opacities in the right lower lobe, no evidence of pneumonia, will follow Repeat CT chest without contrast in February 2022

## 2020-09-15 ENCOUNTER — Other Ambulatory Visit (HOSPITAL_COMMUNITY): Payer: Self-pay | Admitting: Family Medicine

## 2020-09-16 ENCOUNTER — Other Ambulatory Visit (HOSPITAL_COMMUNITY): Payer: Self-pay | Admitting: Family Medicine

## 2020-09-16 DIAGNOSIS — N6320 Unspecified lump in the left breast, unspecified quadrant: Secondary | ICD-10-CM

## 2020-09-17 ENCOUNTER — Ambulatory Visit (HOSPITAL_COMMUNITY)
Admission: RE | Admit: 2020-09-17 | Discharge: 2020-09-17 | Disposition: A | Discharge status: Home / Self Care | Payer: Medicare Other | Source: Ambulatory Visit | Attending: Family Medicine | Admitting: Family Medicine

## 2020-09-17 ENCOUNTER — Other Ambulatory Visit (HOSPITAL_COMMUNITY)
Admission: RE | Admit: 2020-09-17 | Discharge: 2020-09-17 | Disposition: A | Discharge status: Home / Self Care | Payer: Medicare Other | Source: Home / Self Care | Attending: Nurse Practitioner | Admitting: Nurse Practitioner

## 2020-09-17 ENCOUNTER — Other Ambulatory Visit: Payer: Self-pay

## 2020-09-17 DIAGNOSIS — J449 Chronic obstructive pulmonary disease, unspecified: Secondary | ICD-10-CM | POA: Insufficient documentation

## 2020-09-17 DIAGNOSIS — Z9981 Dependence on supplemental oxygen: Secondary | ICD-10-CM | POA: Diagnosis not present

## 2020-09-17 DIAGNOSIS — I10 Essential (primary) hypertension: Secondary | ICD-10-CM | POA: Insufficient documentation

## 2020-09-17 DIAGNOSIS — E7800 Pure hypercholesterolemia, unspecified: Secondary | ICD-10-CM | POA: Insufficient documentation

## 2020-09-17 DIAGNOSIS — N6320 Unspecified lump in the left breast, unspecified quadrant: Secondary | ICD-10-CM

## 2020-09-17 DIAGNOSIS — N644 Mastodynia: Secondary | ICD-10-CM | POA: Diagnosis not present

## 2020-09-17 LAB — COMPREHENSIVE METABOLIC PANEL
ALT: 16 U/L (ref 0–44)
AST: 27 U/L (ref 15–41)
Albumin: 3.5 g/dL (ref 3.5–5.0)
Alkaline Phosphatase: 60 U/L (ref 38–126)
Anion gap: 7 (ref 5–15)
BUN: 15 mg/dL (ref 8–23)
CO2: 31 mmol/L (ref 22–32)
Calcium: 8.8 mg/dL — ABNORMAL LOW (ref 8.9–10.3)
Chloride: 99 mmol/L (ref 98–111)
Creatinine, Ser: 0.8 mg/dL (ref 0.44–1.00)
GFR, Estimated: >60 mL/min (ref >60)
Glucose, Bld: 92 mg/dL (ref 70–99)
Potassium: 3.9 mmol/L (ref 3.5–5.1)
Sodium: 137 mmol/L (ref 135–145)
Total Bilirubin: 0.3 mg/dL (ref 0.3–1.2)
Total Protein: 6.3 g/dL — ABNORMAL LOW (ref 6.5–8.1)

## 2020-09-17 LAB — CBC
HCT: 29.8 % — ABNORMAL LOW (ref 36.0–46.0)
Hemoglobin: 9 g/dL — ABNORMAL LOW (ref 12.0–15.0)
MCH: 27.5 pg (ref 26.0–34.0)
MCHC: 30.2 g/dL (ref 30.0–36.0)
MCV: 91.1 fL (ref 80.0–100.0)
Platelets: 235 10*3/uL (ref 150–400)
RBC: 3.27 MIL/uL — ABNORMAL LOW (ref 3.87–5.11)
RDW: 13 % (ref 11.5–15.5)
WBC: 3.2 10*3/uL — ABNORMAL LOW (ref 4.0–10.5)
nRBC: 0 % (ref 0.0–0.2)

## 2020-09-17 LAB — LIPID PANEL
Cholesterol: 170 mg/dL (ref 0–200)
HDL: 79 mg/dL (ref >40)
LDL Cholesterol: 69 mg/dL (ref 0–99)
Total CHOL/HDL Ratio: 2.2 RATIO
Triglycerides: 112 mg/dL (ref <150)
VLDL: 22 mg/dL (ref 0–40)

## 2020-09-17 LAB — TSH: TSH: 1.468 u[IU]/mL (ref 0.350–4.500)

## 2020-10-07 ENCOUNTER — Telehealth: Payer: Self-pay | Admitting: Radiation Oncology

## 2020-10-07 NOTE — Telephone Encounter (Signed)
Phoned patient as requested by Freeman Caldron, PA-C to inquire if she plans to continue her regular follow up care with Dr. Elsworth Soho or our office. Sarah Rich verbalized her intentions to follow up with Dr. Elsworth Soho and confirmed awareness of CT scan already scheduled for this month by him. Encouraged her to phone this RN with future needs. She verbalized understanding and appreciation for the call.

## 2020-10-07 NOTE — Telephone Encounter (Signed)
-----   Message from Freeman Caldron, [place] sent at 10/05/2020  8:56 PM EST ----- Regarding: Follow up care plan Will you please call [name] to determine her intentions to continue her routine follow up with imaging with her pulmonologist, Dr. Elsworth Soho or is she planning to see Sarah Rich? I see that he has her scheduled for a CT Chest in 10/2020 and I do not want to duplicate efforts.  If she is seeing him regularly, she will not need to continue in follow up with Sarah Rich. Just let me know what her preference is. Lia Foyer

## 2020-10-15 ENCOUNTER — Ambulatory Visit (HOSPITAL_COMMUNITY): Payer: Medicare Other

## 2020-10-21 ENCOUNTER — Ambulatory Visit: Payer: Self-pay | Admitting: Urology

## 2020-10-26 ENCOUNTER — Other Ambulatory Visit: Payer: Self-pay

## 2020-10-26 ENCOUNTER — Ambulatory Visit (HOSPITAL_COMMUNITY)
Admission: RE | Admit: 2020-10-26 | Discharge: 2020-10-26 | Disposition: A | Discharge status: Home / Self Care | Payer: Medicare Other | Source: Ambulatory Visit | Attending: Pulmonary Disease | Admitting: Pulmonary Disease

## 2020-10-26 DIAGNOSIS — R911 Solitary pulmonary nodule: Secondary | ICD-10-CM | POA: Insufficient documentation

## 2020-11-12 ENCOUNTER — Other Ambulatory Visit: Payer: Self-pay | Admitting: Pulmonary Disease

## 2020-11-16 ENCOUNTER — Encounter: Payer: Self-pay | Admitting: Pulmonary Disease

## 2020-11-16 ENCOUNTER — Other Ambulatory Visit: Payer: Self-pay

## 2020-11-16 ENCOUNTER — Ambulatory Visit (INDEPENDENT_AMBULATORY_CARE_PROVIDER_SITE_OTHER): Payer: Medicare Other | Admitting: Pulmonary Disease

## 2020-11-16 VITALS — BP 148/80 | HR 67 | Temp 97.1°F | Ht 68.0 in | Wt 137.4 lb

## 2020-11-16 DIAGNOSIS — J9611 Chronic respiratory failure with hypoxia: Secondary | ICD-10-CM | POA: Diagnosis not present

## 2020-11-16 DIAGNOSIS — R911 Solitary pulmonary nodule: Secondary | ICD-10-CM | POA: Diagnosis not present

## 2020-11-16 DIAGNOSIS — J441 Chronic obstructive pulmonary disease with (acute) exacerbation: Secondary | ICD-10-CM | POA: Diagnosis not present

## 2020-11-16 DIAGNOSIS — F17200 Nicotine dependence, unspecified, uncomplicated: Secondary | ICD-10-CM | POA: Diagnosis not present

## 2020-11-16 MED ORDER — TRELEGY ELLIPTA 100-62.5-25 MCG/INH IN AEPB: 1.0000 | INHALATION_SPRAY | Freq: Every day | RESPIRATORY_TRACT | 3 refills | Status: DC

## 2020-11-16 MED ORDER — PREDNISONE 10 MG PO TABS
ORAL_TABLET | ORAL | 0 refills | Status: DC
Start: 2020-11-16 — End: 2021-03-10

## 2020-11-16 MED ORDER — TRELEGY ELLIPTA 100-62.5-25 MCG/INH IN AEPB
1.0000 | INHALATION_SPRAY | Freq: Every day | RESPIRATORY_TRACT | 0 refills | Status: DC
Start: 2020-11-16 — End: 2021-03-10

## 2020-11-16 NOTE — Patient Instructions (Signed)
Ambulatory sat - based on this we will increase O2 on home concentrator Ct chest WO contrast in 50months - follow up left nodule Refills on trelegy  Prednisone 10 mg tabs  Take 2 tabs daily with food x 5ds, then 1 tab daily with food x 5ds then STOP

## 2020-11-16 NOTE — Assessment & Plan Note (Signed)
Son states that she desats very quickly after coughing spells.  We will try to provide her an oxygen concentrator that goes higher but advised against higher flow oxygen for longer periods for fear of hypercarbia

## 2020-11-16 NOTE — Assessment & Plan Note (Signed)
Likely related to missing her Trelegy for a few days. We will give her a short prednisone taper Prednisone 10 mg tabs  Take 2 tabs daily with food x 5ds, then 1 tab daily with food x 5ds then STOP  I also gave her alternatives to Trelegy including Breztri or Symbicort plus Spiriva combination

## 2020-11-16 NOTE — Progress Notes (Signed)
   Subjective:    Patient ID: Sarah Rich, female    DOB: 09-25-1945, 75 y.o.   MRN: [id number]  HPI  75 yo smoker for FU of  severe COPD and hypoxic respiratory failure  She had anenlarging left lower lobe hypermetabolic nodule, adjacent to pericardium and underwent SBRT 05/2019,felt to be too high risk to biopsy and not a candidate for VATS   She is on oxygen since her hospitalization in 1976, uses 3 L at home and 2 L pulse on her POC. She continues to smoke 3 to 4 cigarettes/day.   Started on trelegy 01/2020 - She is accompanied by her son today and has several issues to discuss  -Oxygen level drops especially when she has coughing spells and then she needs to increase her oxygen, son requests O2 concentrator which can go up to 8 to 10 L, her current concentrator only goes up to 5 L. -Trelegy  is very expensive $120 , although works well for her.  She ran out of the medication and has not taken anything for the last 3 days -She complains of left arm/shoulder pain especially on arm movements, son reports a fall a few months ago  On ambulation heart rate increased from 60-80 and oxygen saturation stayed at 98% on 3 L pulse.  Patient stopped stating that she had to sit down and could not walk any further due to fatigue  We reviewed CT chest from February  Significant tests/ events reviewed  CT chest 10/2020 >>stable RML & peripheral LLL ground glass nodules. New 2 x 1.3 cm LLL nodule , reduced size of LLL nodule post RT  CT chest 04/2020 left lower lobe nodule abutting the pericardium/pleura stable, additional 4 mm lingular nodule -Patchy groundglass opacities in the right lower lobe  CT chest 09/2019 >>Within the LEFT lower lobe, nodule abutting the pericardial surface is again demonstrated measuring 16 mm x 15 mm compared to 16 mm x 16 mm on CT 01/13/2019 and from 19 mm x 13 mm on most recent exam from 06/26/2019.  -Ground-glass nodule in the RIGHT middle lobe 1.5 by 1.4 cm is  unchanged  PFTs 03/2019 severe airway obstruction, ratio 30, FEV1 0.54/20%, FVC 53%, DLCO 35% ABG 10/2015 7.3 2/54/322 100%  Review of Systems  neg for any significant sore throat, dysphagia, itching, sneezing, nasal congestion or excess/ purulent secretions, fever, chills, sweats, unintended wt loss, pleuritic or exertional cp, hempoptysis, orthopnea pnd or change in chronic leg swelling. Also denies presyncope, palpitations, heartburn, abdominal pain, nausea, vomiting, diarrhea or change in bowel or urinary habits, dysuria,hematuria, rash, arthralgias, visual complaints, headache, numbness weakness or ataxia.     Objective:   Physical Exam  Gen. Pleasant, chronically ill-appearing, on oxygen nasal cannula, in no distress ENT - no lesions, no post nasal drip Neck: No JVD, no thyromegaly, no carotid bruits Lungs: no use of accessory muscles, no dullness to percussion, decreased without rales or rhonchi  Cardiovascular: Rhythm regular, heart sounds  normal, no murmurs or gallops, no peripheral edema Musculoskeletal: No deformities, no cyanosis or clubbing , no tremors       Assessment & Plan:

## 2020-11-16 NOTE — Assessment & Plan Note (Signed)
Previous nodule status post SBRT has decreased in size.  This will still need follow-up given proximity to pericardium.  2 other nodules are stable  Nodule is noted in the left lower lobe which given its acute appearance is likely to be infectious inflammatory. We will need short-term follow-up CT chest without contrast in 3 months

## 2020-11-16 NOTE — Assessment & Plan Note (Signed)
Smoking cessation was emphasized is the most important intervention for her.  She says "I am like an addict and hard to give up"

## 2020-12-09 ENCOUNTER — Other Ambulatory Visit: Payer: Self-pay | Admitting: Pulmonary Disease

## 2021-02-15 ENCOUNTER — Ambulatory Visit (HOSPITAL_COMMUNITY)
Admission: RE | Admit: 2021-02-15 | Discharge: 2021-02-15 | Disposition: A | Discharge status: Home / Self Care | Payer: Medicare Other | Source: Ambulatory Visit | Attending: Pulmonary Disease | Admitting: Pulmonary Disease

## 2021-02-15 DIAGNOSIS — R911 Solitary pulmonary nodule: Secondary | ICD-10-CM | POA: Diagnosis not present

## 2021-02-17 ENCOUNTER — Other Ambulatory Visit: Payer: Self-pay | Admitting: Pulmonary Disease

## 2021-02-17 DIAGNOSIS — R911 Solitary pulmonary nodule: Secondary | ICD-10-CM

## 2021-02-17 NOTE — Progress Notes (Signed)
Spoke with pt and notified of results per Dr. Elsworth Soho. Pt verbalized understanding and denied any questions. She was agreeable to f/u CT in Oct 2022 and I have placed order for this.

## 2021-03-10 ENCOUNTER — Ambulatory Visit (INDEPENDENT_AMBULATORY_CARE_PROVIDER_SITE_OTHER): Payer: Medicare Other | Admitting: Pulmonary Disease

## 2021-03-10 ENCOUNTER — Encounter: Payer: Self-pay | Admitting: Pulmonary Disease

## 2021-03-10 ENCOUNTER — Other Ambulatory Visit: Payer: Self-pay

## 2021-03-10 VITALS — BP 138/70 | HR 72 | Temp 97.3°F | Ht 68.0 in | Wt 131.6 lb

## 2021-03-10 DIAGNOSIS — J9611 Chronic respiratory failure with hypoxia: Secondary | ICD-10-CM | POA: Diagnosis not present

## 2021-03-10 DIAGNOSIS — C3432 Malignant neoplasm of lower lobe, left bronchus or lung: Secondary | ICD-10-CM

## 2021-03-10 DIAGNOSIS — R911 Solitary pulmonary nodule: Secondary | ICD-10-CM | POA: Diagnosis not present

## 2021-03-10 DIAGNOSIS — J432 Centrilobular emphysema: Secondary | ICD-10-CM

## 2021-03-10 NOTE — Progress Notes (Signed)
   Subjective:    Patient ID: Sarah Rich, female    DOB: 11-Jun-1946, 75 y.o.   MRN: [id number]  HPI  75 yo smoker for FU of  severe COPD and hypoxic respiratory failure She had an enlarging left lower lobe hypermetabolic nodule, adjacent to pericardium and underwent SBRT 05/2019, felt to be too high risk to biopsy and not a candidate for VATS   She is on oxygen since her hospitalization in 2019, uses 3 L at home and 2 L pulse on her POC. She continues to smoke 3 to 4 cigarettes/day.   Started on trelegy 01/2020 -      Chief Complaint  Patient presents with   Follow-up    Patient is here to follow up after CT scan on 02/15/21. Patient is feeling the same since last visit. No new concerns.    She is able to afford Trelegy We discussed and reviewed CT scan today Complains of pedal edema, breathing is on baseline , coughing spells have subsided     Significant tests/ events reviewed 11/2020 On ambulation heart rate increased from 60-80 and oxygen saturation stayed at 98% on 3 L pulse.  CT chest 02/2021 >> left lower lobe nodule decreased in size to 1.8 x 0.9 cm , new nodule right lower lobe 0.9 x 0.8 cm, all other nodules stable    CT chest 10/2020 >>stable RML & peripheral LLL ground glass nodules. New 2 x 1.3 cm LLL nodule , reduced size of LLL nodule post RT   CT chest 04/2020 left lower lobe nodule abutting the pericardium/pleura stable, additional 4 mm lingular nodule -Patchy groundglass opacities in the right lower lobe   CT chest 09/2019 >> Within the LEFT lower lobe, nodule abutting the pericardial surface is again demonstrated measuring 16 mm x 15 mm compared to 16 mm x 16 mm on CT 01/13/2019 and from 19 mm x 13 mm on most recent exam from 06/26/2019.   -Ground-glass nodule in the RIGHT middle lobe 1.5 by 1.4 cm is unchanged   PFTs 03/2019 severe airway obstruction, ratio 30, FEV1 0.54/20%, FVC 53%, DLCO 35% ABG 10/2015 7.3 2/54/322 100%    Review of Systems neg for any  significant sore throat, dysphagia, itching, sneezing, nasal congestion or excess/ purulent secretions, fever, chills, sweats, unintended wt loss, pleuritic or exertional cp, hempoptysis, orthopnea pnd or change in chronic leg swelling. Also denies presyncope, palpitations, heartburn, abdominal pain, nausea, vomiting, diarrhea or change in bowel or urinary habits, dysuria,hematuria, rash, arthralgias, visual complaints, headache, numbness weakness or ataxia.      Objective:   Physical Exam  Gen. Pleasant, well-nourished, elderly,in no distress, normal affect ENT - no pallor,icterus, no post nasal drip Neck: No JVD, no thyromegaly, no carotid bruits Lungs: no use of accessory muscles, no dullness to percussion, decreased without rales  faint scattered BL rhonchi  Cardiovascular: Rhythm regular, heart sounds  normal, no murmurs or gallops, 1+ peripheral edema Abdomen: soft and non-tender, no hepatosplenomegaly, BS normal. Musculoskeletal: No deformities, no cyanosis or clubbing Neuro:  alert, non focal       Assessment & Plan:

## 2021-03-10 NOTE — Assessment & Plan Note (Signed)
She is compliant with oxygen and this has certainly helped improve her functioning

## 2021-03-10 NOTE — Patient Instructions (Addendum)
We discussed CT scan Stay on trelegy Take lasix x 1 dose You have to QUIT smoking !! Repeat CT chest Wo con  December 1st week

## 2021-03-10 NOTE — Assessment & Plan Note (Signed)
Continue Trelegy.

## 2021-03-10 NOTE — Assessment & Plan Note (Signed)
This nodule seems to abut the pericardium appears stable from previous CTs

## 2021-03-10 NOTE — Assessment & Plan Note (Signed)
This appears to have decreased in size which is reassuring however there is some retraction of the fissure so will need continued follow-up Also there is a new nodule in the right lower lobe, will schedule follow-up CT chest without contrast in December

## 2021-05-10 ENCOUNTER — Other Ambulatory Visit: Payer: Self-pay | Admitting: Pulmonary Disease

## 2021-05-20 ENCOUNTER — Telehealth: Payer: Self-pay | Admitting: Pulmonary Disease

## 2021-05-20 NOTE — Telephone Encounter (Signed)
I only see a order for CT for first week of December. The one for October says it was cancelled. Please schedule for first week of December. Thanks

## 2021-05-21 NOTE — Telephone Encounter (Signed)
Thanks [place] I have closed the old order we will call patient in November for December

## 2021-06-25 ENCOUNTER — Other Ambulatory Visit: Payer: Self-pay | Admitting: Pulmonary Disease

## 2021-08-06 ENCOUNTER — Ambulatory Visit (HOSPITAL_COMMUNITY): Payer: Medicare Other

## 2021-08-13 ENCOUNTER — Ambulatory Visit: Payer: Medicare Other | Admitting: Pulmonary Disease

## 2021-09-02 ENCOUNTER — Other Ambulatory Visit: Payer: Self-pay

## 2021-09-02 ENCOUNTER — Ambulatory Visit (HOSPITAL_COMMUNITY)
Admission: RE | Admit: 2021-09-02 | Discharge: 2021-09-02 | Disposition: A | Discharge status: Home / Self Care | Payer: Medicare Other | Source: Ambulatory Visit | Attending: Pulmonary Disease | Admitting: Pulmonary Disease

## 2021-09-02 DIAGNOSIS — J432 Centrilobular emphysema: Secondary | ICD-10-CM | POA: Diagnosis present

## 2021-09-02 DIAGNOSIS — R911 Solitary pulmonary nodule: Secondary | ICD-10-CM | POA: Insufficient documentation

## 2021-09-22 ENCOUNTER — Other Ambulatory Visit: Payer: Self-pay | Admitting: Pulmonary Disease

## 2021-10-11 ENCOUNTER — Other Ambulatory Visit: Payer: Self-pay

## 2021-10-11 ENCOUNTER — Ambulatory Visit (INDEPENDENT_AMBULATORY_CARE_PROVIDER_SITE_OTHER): Payer: Medicare Other | Admitting: Pulmonary Disease

## 2021-10-11 ENCOUNTER — Encounter: Payer: Self-pay | Admitting: Pulmonary Disease

## 2021-10-11 VITALS — BP 144/86 | HR 102 | Temp 98.7°F | Ht 66.0 in | Wt 122.0 lb

## 2021-10-11 DIAGNOSIS — R911 Solitary pulmonary nodule: Secondary | ICD-10-CM | POA: Diagnosis not present

## 2021-10-11 DIAGNOSIS — J432 Centrilobular emphysema: Secondary | ICD-10-CM | POA: Diagnosis not present

## 2021-10-11 DIAGNOSIS — J9611 Chronic respiratory failure with hypoxia: Secondary | ICD-10-CM | POA: Diagnosis not present

## 2021-10-11 MED ORDER — TRELEGY ELLIPTA 100-62.5-25 MCG/ACT IN AEPB: INHALATION_SPRAY | RESPIRATORY_TRACT | 11 refills | Status: DC

## 2021-10-11 MED ORDER — PREDNISONE 10 MG PO TABS: ORAL_TABLET | ORAL | 0 refills | Status: AC

## 2021-10-11 NOTE — Patient Instructions (Addendum)
°  X Prednisone 10 mg tabs Take 4 tabs  daily with food x 3 days, then 3 tabs daily x 3 days, then 2 tabs daily x 3 days, then 1 tab daily x3 days then stop. #40  X CT chest wo con in April 2023

## 2021-10-11 NOTE — Assessment & Plan Note (Signed)
Reviewed CT scan and left lower lobe nodule which received radiation is now turned into a scar. There is a new nodule in the right lower lobe which is down from groundglass to increased density and we will do a 56-month follow-up.  Unfortunately she is not a candidate for a biopsy.  Perhaps we can offer her more radiation if this is convincing for cancer

## 2021-10-11 NOTE — Assessment & Plan Note (Signed)
Continue Trelegy. We will treat her for an exacerbation today with a course of prednisone

## 2021-10-11 NOTE — Progress Notes (Signed)
Subjective:    Patient ID: Sarah Rich, female    DOB: 1946-04-05, 76 y.o.   MRN: [id number]  HPI  76 yo smoker for FU of  severe COPD and hypoxic respiratory failure She had an enlarging left lower lobe hypermetabolic nodule, adjacent to pericardium and underwent SBRT 05/2019, felt to be too high risk to biopsy and not a candidate for VATS   She is on oxygen since her hospitalization in 2019, uses 3 L at home and 2 L pulse on her POC. She continues to smoke 3 to 4 cigarettes/day.   Started on trelegy 01/2020 -  Chief Complaint  Patient presents with   Follow-up    Feels breathing has declined. Feels cold weather has made breathing worsened.  At home-  6-8LO2  While out- 4LO2 cont.    Accompanied by her son today, she arrives in a wheelchair on 3 to 4 L of oxygen, saturation is 90%. Son tells me that she is dementia She is clearly declining from her last visit, she has lost 9 pounds in the last 6 months. She is compliant with Trelegy, seems like she is using more oxygen at home, she stays inside, son does the shopping She is unable to take care of herself but is not willing to go to a care facility or allow some help to come into the's  She continues to smoke 3 to 4 cigarettes/day.  We reviewed CT scan  Significant tests/ events reviewed 11/2020 On ambulation heart rate increased from 60-80 and oxygen saturation stayed at 98% on 3 L pulse.   CT chest 08/2021 LLL scar, RML GG nodule stable size but increased density , RLL nodule -resolved   CT chest 02/2021 >> left lower lobe nodule decreased in size to 1.8 x 0.9 cm , new nodule right lower lobe 0.9 x 0.8 cm, all other nodules stable     CT chest 10/2020 >>stable RML & peripheral LLL ground glass nodules. New 2 x 1.3 cm LLL nodule , reduced size of LLL nodule post RT   CT chest 04/2020 left lower lobe nodule abutting the pericardium/pleura stable, additional 4 mm lingular nodule -Patchy groundglass opacities in the right lower  lobe   CT chest 09/2019 >> Within the LEFT lower lobe, nodule abutting the pericardial surface is again demonstrated measuring 16 mm x 15 mm compared to 16 mm x 16 mm on CT 01/13/2019 and from 19 mm x 13 mm on most recent exam from 06/26/2019.   -Ground-glass nodule in the RIGHT middle lobe 1.5 by 1.4 cm is unchanged   PFTs 03/2019 severe airway obstruction, ratio 30, FEV1 0.54/20%, FVC 53%, DLCO 35% ABG 10/2015 7.3 2/54/322 100%  Review of Systems neg for any significant sore throat, dysphagia, itching, sneezing, nasal congestion or excess/ purulent secretions, fever, chills, sweats, unintended wt loss, pleuritic or exertional cp, hempoptysis, orthopnea pnd or change in chronic leg swelling. Also denies presyncope, palpitations, heartburn, abdominal pain, nausea, vomiting, diarrhea or change in bowel or urinary habits, dysuria,hematuria, rash, arthralgias, visual complaints, headache, numbness weakness or ataxia.      Objective:   Physical Exam  Gen. Pleasant, elderly, thin woman in lead wheelchair, depressed affect in no distress ENT - no thrush, no pallor/icterus,no post nasal drip Neck: No JVD, no thyromegaly, no carotid bruits Lungs: no use of accessory muscles, no dullness to percussion, decreased without without rales or rhonchi  Cardiovascular: Rhythm regular, heart sounds  normal, no murmurs or gallops, no peripheral edema  Musculoskeletal: No deformities, no cyanosis or clubbing        Assessment & Plan:

## 2021-10-11 NOTE — Assessment & Plan Note (Signed)
She is requiring more oxygen and unfortunately appears to be declining.  This appears to be the more pressing issue.  Decline appears to be due to dementia, not sure that her respiratory situation is worse today Discussed with the son, ideally she needs some help at home, but she is not willing for this and certainly not ready to going to any kind of facility care.  I am afraid that we are just waiting for an event to happen that will land her in the hospital

## 2021-12-20 ENCOUNTER — Ambulatory Visit (HOSPITAL_COMMUNITY): Admission: RE | Admit: 2021-12-20 | Payer: Medicare Other | Source: Ambulatory Visit

## 2022-01-05 ENCOUNTER — Telehealth: Payer: Self-pay | Admitting: Pulmonary Disease

## 2022-01-05 NOTE — Telephone Encounter (Signed)
Sarah Rich called in requesting a call back before her mom appt tomorrow to discuss some concerns before coming in. Her call back number is [phone] Marking High priority due to caller requesting that the call be returned before the end of day. Please be advise. Routing to triage pool  ?

## 2022-01-05 NOTE — Telephone Encounter (Signed)
Noted.  Will close encounter.  

## 2022-01-05 NOTE — Telephone Encounter (Signed)
Called and spoke with Nira Conn. She wanted to call before the appointment tomorrow. Per Nira Conn, the patient's health has continued to decline since her last office visit. The dementia has gotten worse. The family has offered help as well as offered to get a home health nurse to come and visit the patient. She has refused all help. She is not eating well and has lost a lot of weight. She will take 2-3 bites of food and then states she is full. Nira Conn is not sure about her bathing habits but has noticed that she has been wearing the same clothes for 3-4 days at the time. She is also sleeping more during the day.  ? ?She wanted to see if Dr. Elsworth Soho could possible talk to her about being placed into a facility. The family has talked to her before about being placed into a facility and she refused to go. Nira Conn thinks if Dr. Elsworth Soho talks to the patient about being placed, she will be more likely to go.  ? ?Will route to Dr. Elsworth Soho as a FYI for the visit tomorrow.  ?

## 2022-01-06 ENCOUNTER — Encounter: Payer: Self-pay | Admitting: Pulmonary Disease

## 2022-01-06 ENCOUNTER — Ambulatory Visit (INDEPENDENT_AMBULATORY_CARE_PROVIDER_SITE_OTHER): Payer: Medicare Other | Admitting: Pulmonary Disease

## 2022-01-06 DIAGNOSIS — R627 Adult failure to thrive: Secondary | ICD-10-CM

## 2022-01-06 DIAGNOSIS — F17200 Nicotine dependence, unspecified, uncomplicated: Secondary | ICD-10-CM | POA: Diagnosis not present

## 2022-01-06 DIAGNOSIS — J432 Centrilobular emphysema: Secondary | ICD-10-CM | POA: Diagnosis not present

## 2022-01-06 DIAGNOSIS — J9611 Chronic respiratory failure with hypoxia: Secondary | ICD-10-CM | POA: Diagnosis not present

## 2022-01-06 DIAGNOSIS — E43 Unspecified severe protein-calorie malnutrition: Secondary | ICD-10-CM | POA: Diagnosis not present

## 2022-01-06 NOTE — Assessment & Plan Note (Signed)
Smoking cessation was again emphasized ?

## 2022-01-06 NOTE — Progress Notes (Signed)
? ?Subjective:  ? ? Patient ID: Sarah Rich, female    DOB: 09/07/1945, 76 y.o.   MRN: [id number] ? ?HPI ? ?76 yo smoker for FU of  severe COPD and hypoxic respiratory failure ?She had an enlarging left lower lobe hypermetabolic nodule, adjacent to pericardium and underwent SBRT 05/2019, felt to be too high risk to biopsy and not a candidate for VATS ? ?PMH - dementia  ?  ?She is on oxygen since her hospitalization in 2019, uses 3 L at home and 2 L pulse on her POC. ?She continues to smoke 3 to 4 cigarettes/day.   ?Started on trelegy 01/2020 - ? ?Chief Complaint  ?Patient presents with  ? Follow-up  ?  Patient feels like she is about the same. Does not wear O2 all the time  ?Phone note from ysterday- daughter would like dr. Elsworth Soho to talk to patient about SNF   ? ?Arrives in a wheelchair accompanied by son Roderic Palau.  Children concerned about her worsening health ? ?Per daughter Nira Conn, the patient's health has continued to decline since her last office visit. The dementia has gotten worse. The family has offered help as well as offered to get a home health nurse to come and visit the patient. She has refused all help. She is not eating well and has lost a lot of weight. She will take 2-3 bites of food and then states she is full. Nira Conn is not sure about her bathing habits but has noticed that she has been wearing the same clothes for 3-4 days at the time. She is also sleeping more during the day.  ?  ?Family wants me to talk to her about being placed into a facility. The family has talked to her before about being placed into a facility and she refused to go.  ? ?She has increased bipedal edema.  She has 4 dogs at home ?She states "I am a strong independent woman.  I do not want to go to a nursing home" ?Weight has dropped from 1 31-1 1 4  pounds ?She continues to smoke about 3 to 4 cigarettes daily ? ?Significant tests/ events reviewed ? ?11/2020 On ambulation heart rate increased from 60-80 and oxygen saturation  stayed at 98% on 3 L pulse. ?  ?  ?CT chest 08/2021 LLL scar, RML GG nodule stable size but increased density , RLL nodule -resolved ?  ?CT chest 02/2021 >> left lower lobe nodule decreased in size to 1.8 x 0.9 cm , new nodule right lower lobe 0.9 x 0.8 cm, all other nodules stable ?   ?CT chest 09/2019 >> Within the LEFT lower lobe, nodule abutting the pericardial surface is again demonstrated measuring 16 mm x 15 mm compared to 16 mm x 16 mm on CT 01/13/2019 and from 19 mm x 13 mm on most recent exam from 06/26/2019. ?  ?-Ground-glass nodule in the RIGHT middle lobe 1.5 by 1.4 cm is unchanged ?  ?PFTs 03/2019 severe airway obstruction, ratio 30, FEV1 0.54/20%, FVC 53%, DLCO 35% ?ABG 10/2015 7.3 2/54/322 100% ? ?Review of Systems ?neg for any significant sore throat, dysphagia, itching, sneezing, nasal congestion or excess/ purulent secretions, fever, chills, sweats, unintended wt loss, pleuritic or exertional cp, hempoptysis, orthopnea pnd or change in chronic leg swelling. Also denies presyncope, palpitations, heartburn, abdominal pain, nausea, vomiting, diarrhea or change in bowel or urinary habits, dysuria,hematuria, rash, arthralgias, visual complaints, headache, numbness weakness or ataxia. ? ?   ?Objective:  ? Physical Exam ? ?  Gen. Pleasant, elderly, frail woman in no distress ?ENT - no lesions, no post nasal drip ?Neck: No JVD, no thyromegaly, no carotid bruits ?Lungs: no use of accessory muscles, no dullness to percussion, decreased without rales or rhonchi  ?Cardiovascular: Rhythm regular, heart sounds  normal, no murmurs or gallops, 2+ peripheral edema ?Musculoskeletal: No deformities, no cyanosis or clubbing , no tremors ? ? ? ?   ?Assessment & Plan:  ? ? ? ?Total encounter time was 45 minutes ?

## 2022-01-06 NOTE — Assessment & Plan Note (Signed)
Continue 2 to 3 L of oxygen, maintain saturation 90% and above ?

## 2022-01-06 NOTE — Patient Instructions (Signed)
We advise you to allow home health to help you out at home ? ?Continue trelegy & oxygen ? ?Take lasix daily x 1-2 week until swelling goes down ? ?

## 2022-01-06 NOTE — Assessment & Plan Note (Signed)
She has severe protein calorie malnutrition.  This does not relate to her COPD as much as to her dementia ?

## 2022-01-06 NOTE — Assessment & Plan Note (Signed)
Continue Trelegy with albuterol for rescue ?We discussed COPD action plan ?

## 2022-01-06 NOTE — Assessment & Plan Note (Addendum)
Son Roderic Palau was present. ?I discussed with Terese failure to thrive indicated by her gradual weight loss and worsening home situation.  We discussed her children's concerns.  I explained that she was at high risk for a fall at home.  She would benefit from home health services but she continues to refuse stating that "I am a strong independent woman".  She has 4 dogs at home and that may be driving some of this.  At any rate, she does seem to have capacity although her dementia is worsening and it is clear that she is unable to take care of herself at home.  Unfortunately she seems to be heading towards some acute incident that will land her in the hospital. ?Her son and daughter will continue this conversation with her and hopefully she can be convinced to accept some help at home ?

## 2022-01-09 IMAGING — CT CT CHEST W/ CM
2 of 3 series · 15 of 36 positions shown, 18 images · IV contrast (omnipaque)
Comparison: Chest CT 06/26/2019

CLINICAL DATA: Small cell lung cancer. Follow-up LEFT lower lobe
lung cancer. Radiation therapy 2525.

EXAM:
CT CHEST WITH CONTRAST
TECHNIQUE: Multidetector CT imaging of the chest was performed during
intravenous contrast administration.
CONTRAST:  75mL OMNIPAQUE IOHEXOL 300 MG/ML  SOLN

[Series 2: routine chest with · axial · 0.64mm/px · z∈[+1288,+1564]mm · 12 of 162 slices shown, 15 images]
[im 12/162  mediastinal]
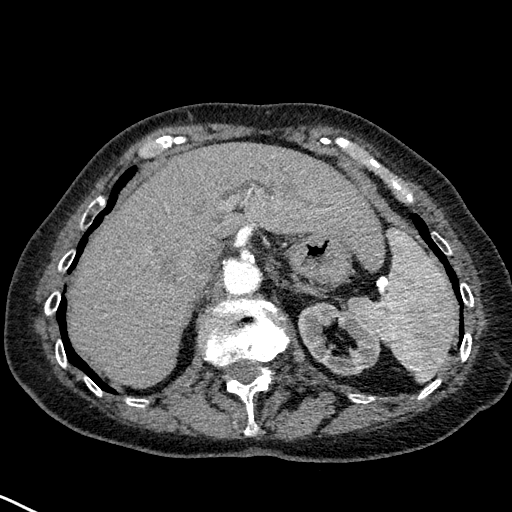
[im 12/162  lung]
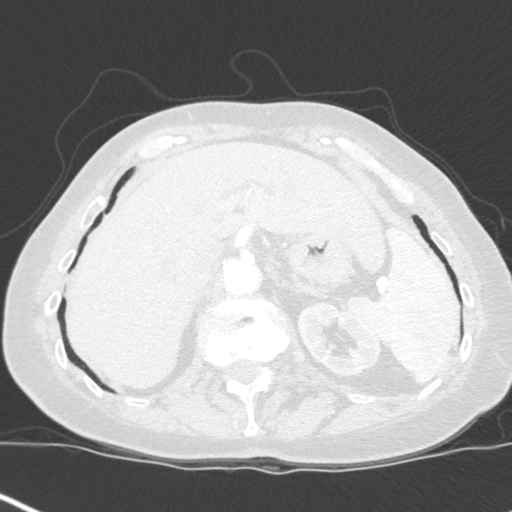
[im 24/162  lung]
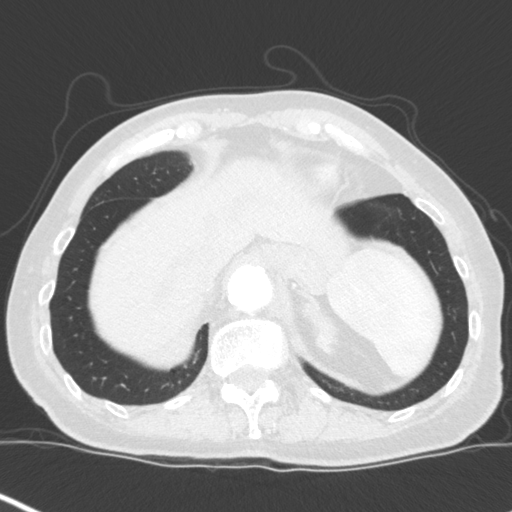
[im 36/162  lung]
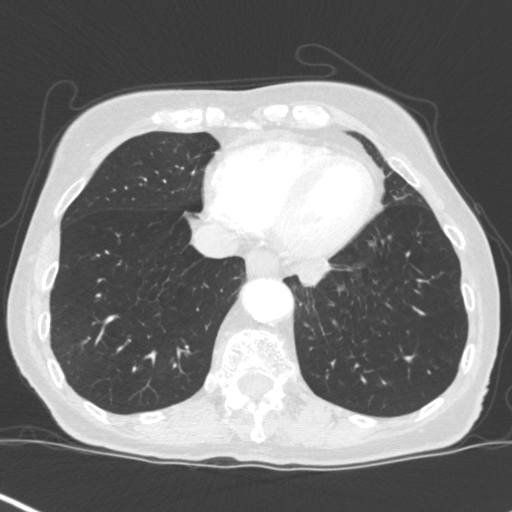
[im 48/162  lung]
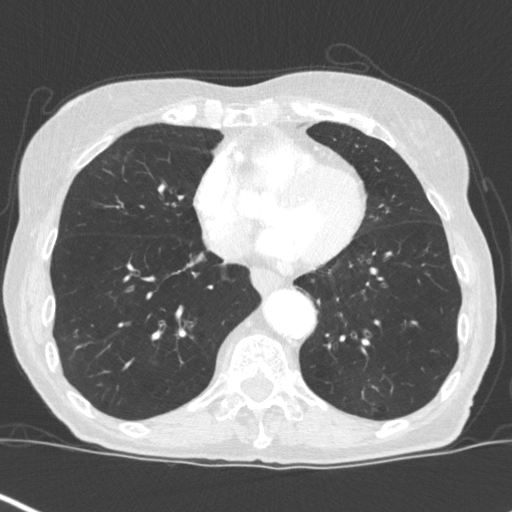
[im 60/162  mediastinal]
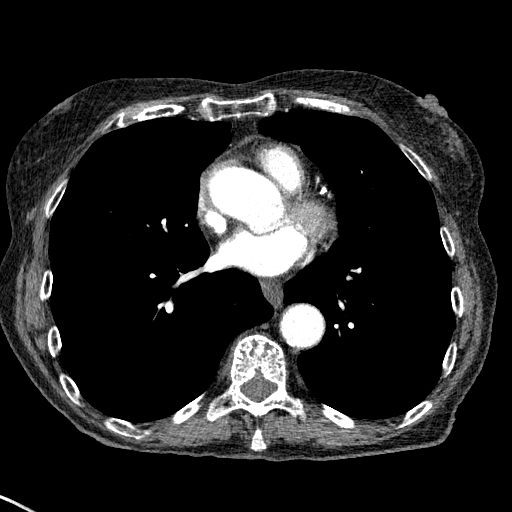
[im 60/162  lung]
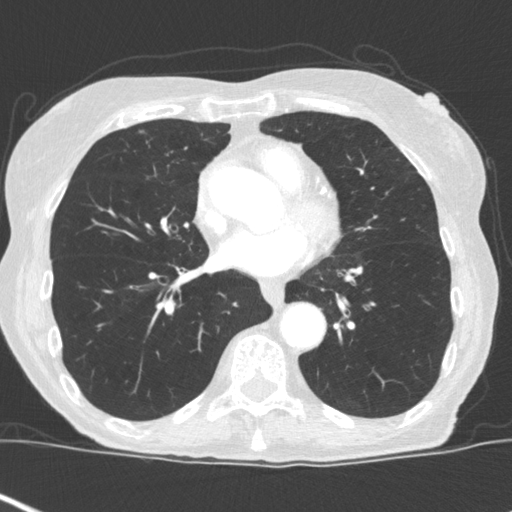
[im 72/162  lung]
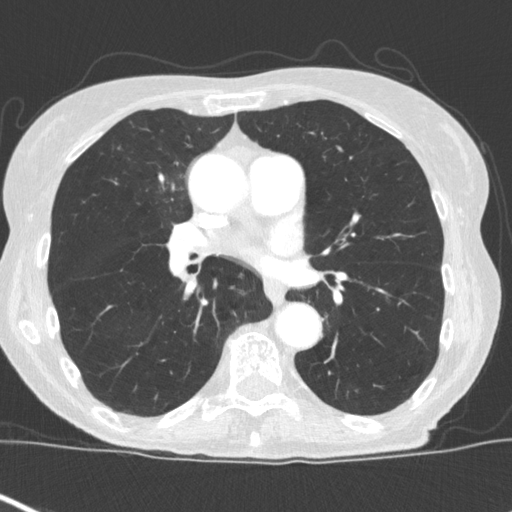
[im 90/162  lung]
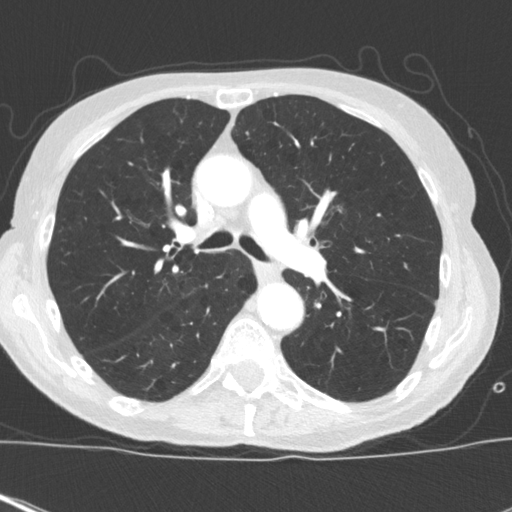
[im 102/162  lung]
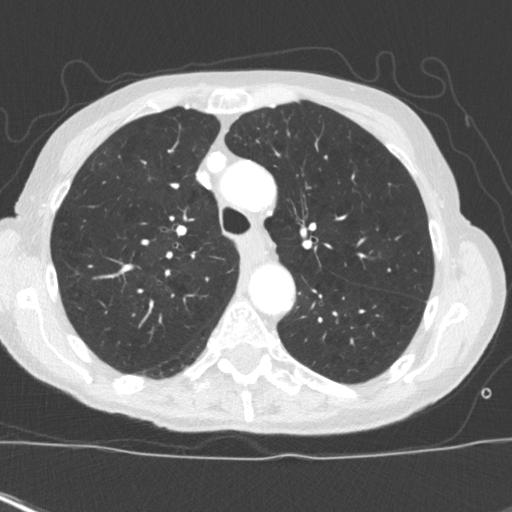
[im 114/162  mediastinal]
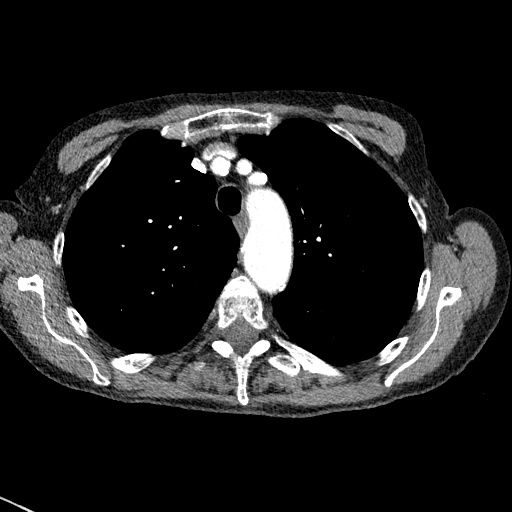
[im 114/162  lung]
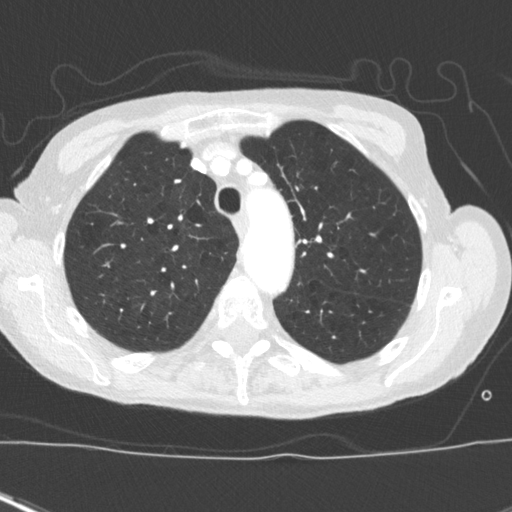
[im 126/162  lung]
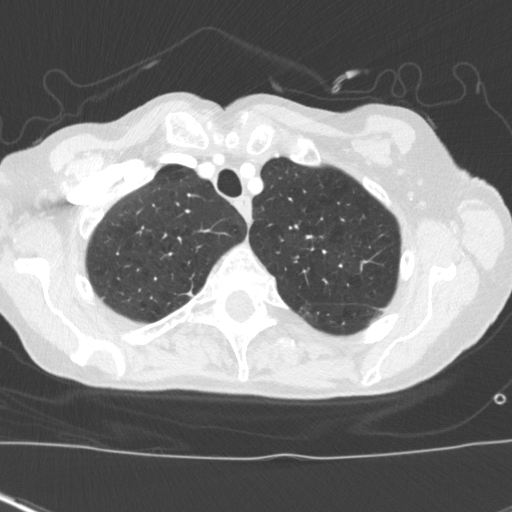
[im 138/162  lung]
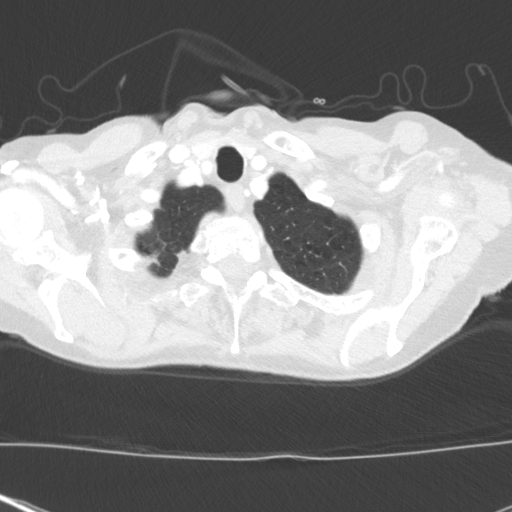
[im 150/162  lung]
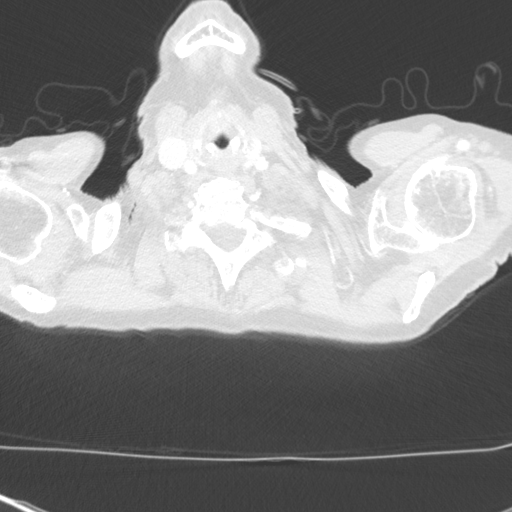

[Series 5: coronal · coronal · 0.64mm/px · 3 of 134 slices shown]
[im 27/134  lung]
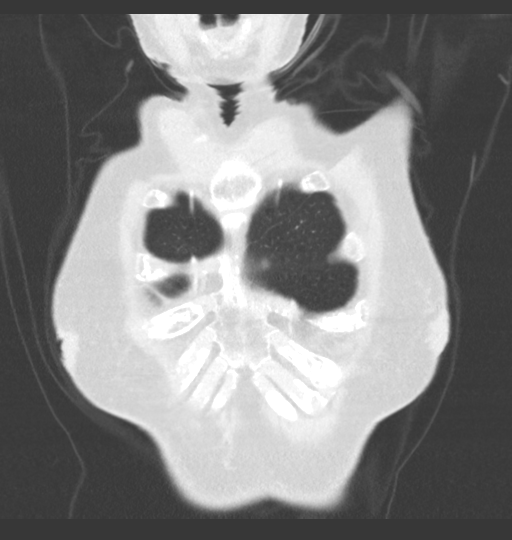
[im 54/134  lung]
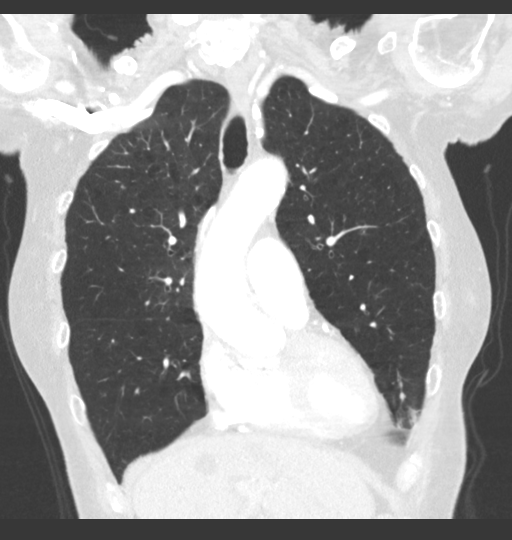
[im 80/134  lung]
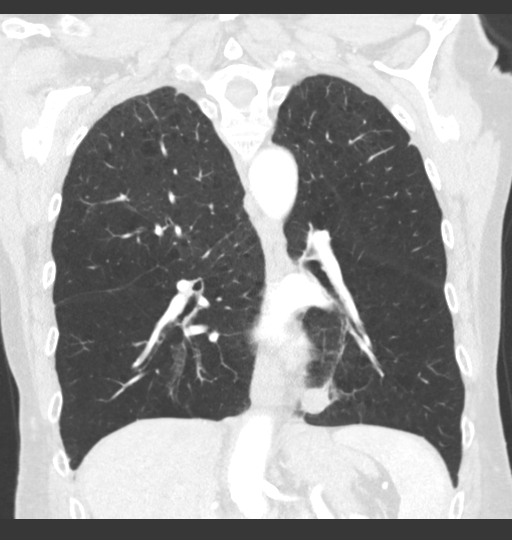

[15 of 36 positions shown; findings below may reference images not displayed]

FINDINGS: Cardiovascular: Coronary artery calcification and aortic
atherosclerotic calcification.

Mediastinum/Nodes: No axillary or supraclavicular adenopathy. No
mediastinal or hilar adenopathy. No pericardial fluid. Esophagus
normal.

Lungs/Pleura: Within the LEFT lower lobe, nodule abutting the
pericardial surface is again demonstrated measuring 16 mm x 15 mm
(image 125/2) compared to 16 mm x 16 mm on CT 01/13/2019 and from 19
mm x 13 mm on most recent exam from 06/26/2019.

Lungs are hyperinflated.  No suspicious nodules.

Ground-glass nodule in the RIGHT middle lobe measuring 1.5 by 1.4 cm
(image 118/4) is unchanged

Upper Abdomen: Limited view of the liver, kidneys, pancreas are
unremarkable. Normal adrenal glands.

Musculoskeletal: No chest wall abnormality. No acute or significant
osseous findings.
IMPRESSION: 1. Stable nodule in the medial LEFT lower lobe.
2. Stable ground-glass nodule in the RIGHT middle lobe.
3. Recommend continued surveillance.
4. Hyperinflated lungs.
5. Coronary artery calcification and Aortic Atherosclerosis
(QUAK3-COV.V).

## 2022-01-20 ENCOUNTER — Inpatient Hospital Stay (HOSPITAL_COMMUNITY)
Admission: EM | Admit: 2022-01-20 | Discharge: 2022-01-26 | DRG: 378 | Disposition: A | Discharge status: Skilled Nursing Facility | Payer: Medicare Other | Attending: Family Medicine | Admitting: Family Medicine

## 2022-01-20 ENCOUNTER — Emergency Department (HOSPITAL_COMMUNITY): Payer: Medicare Other

## 2022-01-20 ENCOUNTER — Encounter (HOSPITAL_COMMUNITY): Payer: Self-pay | Admitting: Emergency Medicine

## 2022-01-20 DIAGNOSIS — E538 Deficiency of other specified B group vitamins: Secondary | ICD-10-CM | POA: Diagnosis present

## 2022-01-20 DIAGNOSIS — D72819 Decreased white blood cell count, unspecified: Secondary | ICD-10-CM | POA: Diagnosis present

## 2022-01-20 DIAGNOSIS — D509 Iron deficiency anemia, unspecified: Secondary | ICD-10-CM

## 2022-01-20 DIAGNOSIS — E782 Mixed hyperlipidemia: Secondary | ICD-10-CM | POA: Diagnosis present

## 2022-01-20 DIAGNOSIS — E46 Unspecified protein-calorie malnutrition: Secondary | ICD-10-CM

## 2022-01-20 DIAGNOSIS — Z7982 Long term (current) use of aspirin: Secondary | ICD-10-CM

## 2022-01-20 DIAGNOSIS — Z9981 Dependence on supplemental oxygen: Secondary | ICD-10-CM

## 2022-01-20 DIAGNOSIS — D62 Acute posthemorrhagic anemia: Secondary | ICD-10-CM | POA: Diagnosis present

## 2022-01-20 DIAGNOSIS — Z87442 Personal history of urinary calculi: Secondary | ICD-10-CM

## 2022-01-20 DIAGNOSIS — J9611 Chronic respiratory failure with hypoxia: Secondary | ICD-10-CM | POA: Diagnosis not present

## 2022-01-20 DIAGNOSIS — K644 Residual hemorrhoidal skin tags: Secondary | ICD-10-CM | POA: Diagnosis present

## 2022-01-20 DIAGNOSIS — F05 Delirium due to known physiological condition: Secondary | ICD-10-CM | POA: Diagnosis present

## 2022-01-20 DIAGNOSIS — Z9049 Acquired absence of other specified parts of digestive tract: Secondary | ICD-10-CM

## 2022-01-20 DIAGNOSIS — I10 Essential (primary) hypertension: Secondary | ICD-10-CM | POA: Diagnosis present

## 2022-01-20 DIAGNOSIS — K3182 Dieulafoy lesion (hemorrhagic) of stomach and duodenum: Principal | ICD-10-CM | POA: Diagnosis present

## 2022-01-20 DIAGNOSIS — Z681 Body mass index (BMI) 19 or less, adult: Secondary | ICD-10-CM

## 2022-01-20 DIAGNOSIS — Z85118 Personal history of other malignant neoplasm of bronchus and lung: Secondary | ICD-10-CM

## 2022-01-20 DIAGNOSIS — K922 Gastrointestinal hemorrhage, unspecified: Secondary | ICD-10-CM

## 2022-01-20 DIAGNOSIS — Z79899 Other long term (current) drug therapy: Secondary | ICD-10-CM

## 2022-01-20 DIAGNOSIS — Z885 Allergy status to narcotic agent status: Secondary | ICD-10-CM

## 2022-01-20 DIAGNOSIS — E440 Moderate protein-calorie malnutrition: Secondary | ICD-10-CM | POA: Diagnosis present

## 2022-01-20 DIAGNOSIS — Z90710 Acquired absence of both cervix and uterus: Secondary | ICD-10-CM

## 2022-01-20 DIAGNOSIS — J449 Chronic obstructive pulmonary disease, unspecified: Secondary | ICD-10-CM | POA: Diagnosis not present

## 2022-01-20 DIAGNOSIS — Z881 Allergy status to other antibiotic agents status: Secondary | ICD-10-CM

## 2022-01-20 DIAGNOSIS — D649 Anemia, unspecified: Secondary | ICD-10-CM | POA: Diagnosis not present

## 2022-01-20 DIAGNOSIS — F17210 Nicotine dependence, cigarettes, uncomplicated: Secondary | ICD-10-CM | POA: Diagnosis present

## 2022-01-20 DIAGNOSIS — J45909 Unspecified asthma, uncomplicated: Secondary | ICD-10-CM | POA: Diagnosis present

## 2022-01-20 DIAGNOSIS — F0390 Unspecified dementia without behavioral disturbance: Secondary | ICD-10-CM | POA: Diagnosis present

## 2022-01-20 DIAGNOSIS — K317 Polyp of stomach and duodenum: Secondary | ICD-10-CM | POA: Diagnosis present

## 2022-01-20 DIAGNOSIS — F17200 Nicotine dependence, unspecified, uncomplicated: Secondary | ICD-10-CM | POA: Diagnosis present

## 2022-01-20 DIAGNOSIS — R911 Solitary pulmonary nodule: Secondary | ICD-10-CM | POA: Diagnosis present

## 2022-01-20 DIAGNOSIS — E8809 Other disorders of plasma-protein metabolism, not elsewhere classified: Secondary | ICD-10-CM | POA: Diagnosis present

## 2022-01-20 DIAGNOSIS — K5900 Constipation, unspecified: Secondary | ICD-10-CM | POA: Diagnosis present

## 2022-01-20 LAB — CBC WITH DIFFERENTIAL/PLATELET
Abs Immature Granulocytes: 0.01 10*3/uL (ref 0.00–0.07)
Basophils Absolute: 0 10*3/uL (ref 0.0–0.1)
Basophils Relative: 1 %
Eosinophils Absolute: 0 10*3/uL (ref 0.0–0.5)
Eosinophils Relative: 0 %
HCT: 17.7 % — ABNORMAL LOW (ref 36.0–46.0)
Hemoglobin: 4.6 g/dL — CL (ref 12.0–15.0)
Immature Granulocytes: 1 %
Lymphocytes Relative: 13 %
Lymphs Abs: 0.3 10*3/uL — ABNORMAL LOW (ref 0.7–4.0)
MCH: 18.9 pg — ABNORMAL LOW (ref 26.0–34.0)
MCHC: 26 g/dL — ABNORMAL LOW (ref 30.0–36.0)
MCV: 72.8 fL — ABNORMAL LOW (ref 80.0–100.0)
Monocytes Absolute: 0.3 10*3/uL (ref 0.1–1.0)
Monocytes Relative: 15 %
Neutro Abs: 1.6 10*3/uL — ABNORMAL LOW (ref 1.7–7.7)
Neutrophils Relative %: 70 %
Platelets: 216 10*3/uL (ref 150–400)
RBC: 2.43 MIL/uL — ABNORMAL LOW (ref 3.87–5.11)
RDW: 17.2 % — ABNORMAL HIGH (ref 11.5–15.5)
WBC: 2.2 10*3/uL — ABNORMAL LOW (ref 4.0–10.5)
nRBC: 0 % (ref 0.0–0.2)

## 2022-01-20 LAB — BASIC METABOLIC PANEL
Anion gap: 11 (ref 5–15)
BUN: 18 mg/dL (ref 8–23)
CO2: 34 mmol/L — ABNORMAL HIGH (ref 22–32)
Calcium: 8.6 mg/dL — ABNORMAL LOW (ref 8.9–10.3)
Chloride: 93 mmol/L — ABNORMAL LOW (ref 98–111)
Creatinine, Ser: 0.98 mg/dL (ref 0.44–1.00)
GFR, Estimated: 60 mL/min — ABNORMAL LOW (ref >60)
Glucose, Bld: 108 mg/dL — ABNORMAL HIGH (ref 70–99)
Potassium: 3.8 mmol/L (ref 3.5–5.1)
Sodium: 138 mmol/L (ref 135–145)

## 2022-01-20 LAB — BRAIN NATRIURETIC PEPTIDE: B Natriuretic Peptide: 88 pg/mL (ref 0.0–100.0)

## 2022-01-20 LAB — IRON AND TIBC
Iron: 13 ug/dL — ABNORMAL LOW (ref 28–170)
Saturation Ratios: 3 % — ABNORMAL LOW (ref 10.4–31.8)
TIBC: 407 ug/dL (ref 250–450)
UIBC: 394 ug/dL

## 2022-01-20 LAB — TROPONIN I (HIGH SENSITIVITY): Troponin I (High Sensitivity): 12 ng/L (ref <18)

## 2022-01-20 LAB — RETICULOCYTES
Immature Retic Fract: 14.6 % (ref 2.3–15.9)
RBC.: 2.43 MIL/uL — ABNORMAL LOW (ref 3.87–5.11)
Retic Count, Absolute: 30.4 10*3/uL (ref 19.0–186.0)
Retic Ct Pct: 1.3 % (ref 0.4–3.1)

## 2022-01-20 LAB — FOLATE: Folate: 5.4 ng/mL — ABNORMAL LOW (ref >5.9)

## 2022-01-20 LAB — ABO/RH: ABO/RH(D): A POS

## 2022-01-20 LAB — VITAMIN B12: Vitamin B-12: 799 pg/mL (ref 180–914)

## 2022-01-20 LAB — HEPATIC FUNCTION PANEL
ALT: 10 U/L (ref 0–44)
AST: 18 U/L (ref 15–41)
Albumin: 3.3 g/dL — ABNORMAL LOW (ref 3.5–5.0)
Alkaline Phosphatase: 52 U/L (ref 38–126)
Bilirubin, Direct: 0.1 mg/dL (ref 0.0–0.2)
Indirect Bilirubin: 0.2 mg/dL — ABNORMAL LOW (ref 0.3–0.9)
Total Bilirubin: 0.3 mg/dL (ref 0.3–1.2)
Total Protein: 5.8 g/dL — ABNORMAL LOW (ref 6.5–8.1)

## 2022-01-20 LAB — FERRITIN: Ferritin: 3 ng/mL — ABNORMAL LOW (ref 11–307)

## 2022-01-20 LAB — POC OCCULT BLOOD, ED: Fecal Occult Bld: POSITIVE — AB

## 2022-01-20 LAB — PREPARE RBC (CROSSMATCH)

## 2022-01-20 MED ORDER — ACETAMINOPHEN 650 MG RE SUPP: 650.0000 mg | Freq: Four times a day (QID) | RECTAL | Status: DC | PRN

## 2022-01-20 MED ORDER — SODIUM CHLORIDE 0.9 % IV SOLN
10.0000 mL/h | Freq: Once | INTRAVENOUS | Status: AC
  Administered 2022-01-20: 10 mL/h via INTRAVENOUS

## 2022-01-20 MED ORDER — ACETAMINOPHEN 325 MG PO TABS: 650.0000 mg | ORAL_TABLET | Freq: Four times a day (QID) | ORAL | Status: DC | PRN

## 2022-01-20 MED ORDER — PANTOPRAZOLE SODIUM 40 MG IV SOLR
40.0000 mg | Freq: Once | INTRAVENOUS | Status: AC
  Administered 2022-01-21: 40 mg via INTRAVENOUS
  Filled 2022-01-20: qty 10

## 2022-01-20 NOTE — ED Triage Notes (Addendum)
Pt arrives [facility] from home after family noticed that pt has had more weakness, increased tremors and decreased O2 sats. Pt on 5L [place] at home sats were 88%. Pt has early dementia and family isn't sure if she has been taking her medications correctly.

## 2022-01-20 NOTE — ED Provider Notes (Signed)
[facility] EMERGENCY DEPARTMENT Provider Note   CSN: [id number] Arrival date & time: 01/20/22  2012     History  Chief Complaint  Patient presents with   Weakness    Sarah Rich is a 76 y.o. female.  HPI 76 year old female presents with shortness of breath and shaking.  History is from patient but also daughter at the bedside.  Patient has been having these episodes for over 1 month but has been getting worse over the last 1 month.  Does not happen every day but has happened twice this week.  Denies episode was worse.  Daughter does not live with the patient but she was called over because of this episode.  Patient looked worse to her tonight than normal.  She wears 5 to 6 L at home chronically of oxygen.  Sometimes she gets chest pain and sometimes does not been did not tonight.  Has been having some leg swelling in both ankles and has recently restarted her Lasix once every 3 days at her doctor's advice.  She had stopped it because she did not want to take it.  The shaking is in her head in both arms and diffuse.  Sometimes she gets dizzy.  Patient has multiple comorbidities which include COPD, hypertension, lung cancer.  Home Medications Prior to Admission medications   Medication Sig Start Date End Date Taking? Authorizing Provider  acetylcysteine (MUCOMYST) 20 % nebulizer solution Take 3 mLs by nebulization 4 (four) times daily. 05/03/18   Kathie Dike, MD  albuterol (PROVENTIL HFA;VENTOLIN HFA) 108 (90 BASE) MCG/ACT inhaler Inhale 2 puffs into the lungs every 6 (six) hours as needed. Shortness of Breath    [provider]  albuterol (PROVENTIL) (2.5 MG/3ML) 0.083% nebulizer solution Take 3 mLs (2.5 mg total) by nebulization 4 (four) times daily. 05/03/18   Kathie Dike, MD  aspirin 81 MG tablet Take 81 mg by mouth daily.    [provider]  atenolol (TENORMIN) 50 MG tablet Take 50 mg by mouth daily.    [provider]  cholecalciferol (VITAMIN D)  1000 UNITS tablet Take 1,000 Units by mouth daily.    [provider]  diazepam (VALIUM) 2 MG tablet Take 2 mg by mouth every 6 (six) hours as needed for anxiety.    [provider]  fluticasone (FLONASE) 50 MCG/ACT nasal spray Place 1-2 sprays into both nostrils daily. 08/30/17   [provider]  Fluticasone-Umeclidin-Vilant (TRELEGY ELLIPTA) 100-62.5-25 MCG/ACT AEPB INHALE 1 PUFF INTO LUNGS ONCE DAILY 10/11/21   Rigoberto Noel, MD  furosemide (LASIX) 40 MG tablet Take 40 mg by mouth daily as needed. 02/18/18   [provider]  potassium chloride (KLOR-CON) 20 MEQ packet Take 20 mEq by mouth once.    [provider]  pravastatin (PRAVACHOL) 40 MG tablet Take 40 mg by mouth daily.    [provider]  vitamin B-12 (CYANOCOBALAMIN) 1000 MCG tablet Take 1,000 mcg by mouth daily.    [provider]      Allergies    Sulfa antibiotics, Codeine, and Levaquin [levofloxacin]    Review of Systems   Review of Systems  Constitutional:  Negative for fever.  Respiratory:  Positive for shortness of breath. Negative for cough.   Cardiovascular:  Positive for leg swelling. Negative for chest pain.  Neurological:  Positive for tremors and weakness.   Physical Exam Updated Vital Signs BP (!) 158/82   Pulse 73   Temp 97.6 F (36.4 C) (Oral)  Resp (!) 24   SpO2 100%  Physical Exam Vitals and nursing note reviewed.  Constitutional:      Appearance: She is well-developed.  HENT:     Head: Normocephalic and atraumatic.  Eyes:     Extraocular Movements: Extraocular movements intact.     Pupils: Pupils are equal, round, and reactive to light.  Cardiovascular:     Rate and Rhythm: Normal rate and regular rhythm.     Heart sounds: Normal heart sounds.  Pulmonary:     Effort: Pulmonary effort is normal.     Breath sounds: Normal breath sounds.  Abdominal:     Palpations: Abdomen is soft.     Tenderness: There is no abdominal tenderness.   Musculoskeletal:     Right lower leg: Edema present.     Left lower leg: Edema present.     Comments: Bilateral foot/ankle edema  Skin:    General: Skin is warm and dry.  Neurological:     Mental Status: She is alert.     Comments: CN 3-12 grossly intact. 5/5 strength in all 4 extremities. Grossly normal sensation. Normal finger to nose. No significant tremor.    ED Results / Procedures / Treatments   Labs (all labs ordered are listed, but only abnormal results are displayed) Labs Reviewed  CBC WITH DIFFERENTIAL/PLATELET - Abnormal; Notable for the following components:      Result Value   WBC 2.2 (*)    RBC 2.43 (*)    Hemoglobin 4.6 (*)    HCT 17.7 (*)    MCV 72.8 (*)    MCH 18.9 (*)    MCHC 26.0 (*)    RDW 17.2 (*)    Neutro Abs 1.6 (*)    Lymphs Abs 0.3 (*)    All other components within normal limits  BASIC METABOLIC PANEL - Abnormal; Notable for the following components:   Chloride 93 (*)    CO2 34 (*)    Glucose, Bld 108 (*)    Calcium 8.6 (*)    GFR, Estimated 60 (*)    All other components within normal limits  HEPATIC FUNCTION PANEL - Abnormal; Notable for the following components:   Total Protein 5.8 (*)    Albumin 3.3 (*)    Indirect Bilirubin 0.2 (*)    All other components within normal limits  RETICULOCYTES - Abnormal; Notable for the following components:   RBC. 2.43 (*)    All other components within normal limits  BRAIN NATRIURETIC PEPTIDE  VITAMIN B12  FOLATE  IRON AND TIBC  FERRITIN  POC OCCULT BLOOD, ED  PREPARE RBC (CROSSMATCH)  TYPE AND SCREEN  ABO/RH  TROPONIN I (HIGH SENSITIVITY)    EKG EKG Interpretation  Date/Time:  Thursday Jan 20 2022 20:28:11 EDT Ventricular Rate:  66 PR Interval:  156 QRS Duration: 94 QT Interval:  408 QTC Calculation: 428 R Axis:   44 Text Interpretation: Sinus rhythm Abnormal R-wave progression, early transition  no acute ST/T changes Confirmed by Sherwood Gambler [phone]) on 01/20/2022 8:35:26  PM  Radiology CT Chest Wo Contrast  Result Date: 01/20/2022 CLINICAL DATA:  Evaluate lung nodule seen on recent chest x-ray EXAM: CT CHEST WITHOUT CONTRAST TECHNIQUE: Multidetector CT imaging of the chest was performed following the standard protocol without IV contrast. RADIATION DOSE REDUCTION: This exam was performed according to the departmental dose-optimization program which includes automated exposure control, adjustment of the mA and/or kV according to patient size and/or use of iterative reconstruction technique. COMPARISON:  Chest x-ray from earlier in the same day as well as CT from 09/02/2021 FINDINGS: Cardiovascular: Somewhat limited due to lack of IV contrast. Atherosclerotic calcifications are noted of the thoracic aorta. No aneurysmal dilatation is seen. Heavy coronary calcifications are noted. No cardiac enlargement is seen. Cardiac blood pool is decreased in attenuation which may be related to underlying anemia. Mediastinum/Nodes: Thoracic inlet is within normal limits. No hilar or mediastinal adenopathy is noted. The esophagus is within normal limits. Lungs/Pleura: Lungs are well aerated bilaterally. The previously seen nodule in the left lower lobe is again seen and stable in appearance. Some persistent traction upon the fissure is seen but stable. 17 mm nodule is noted along the posterior aspect of the pericardium in the medial aspect of the left lobe of the liver best seen on image number 125 of series 4. This is stable in appearance from the prior exam. Ground-glass nodule is noted in the posterior left lower lobe best seen on image number 106 of series 4 measuring up to 14 mm. This is slightly larger than that seen on the prior exam and may be related to technical factors. The ground-glass nodule seen previously in the right middle lobe is again identified measuring up to 18 mm. The solid component however appears more prominent now measuring up to 6 mm. No other significant nodular  changes in the right lung are noted. Diffuse emphysematous changes are noted throughout both lungs. No effusion is noted. Upper Abdomen: Visualized upper abdomen shows no acute abnormality. Musculoskeletal: No rib abnormality is noted. Degenerative changes of the thoracic spine are seen. No acute abnormality is noted. IMPRESSION: Slight increase in the solid component in a ground-glass nodule in the right middle lobe. A portion of this corresponds to the changes on recent chest x-ray although the majority of the density is felt to be related to the overlying nipple shadow given its location. Given the increase in solid component however, PET-CT is recommended for further evaluation. Stable nodules in the left lower lobe, one superiorly with pleural retraction and a second along the posterior aspect of the pericardium. These can also be followed on subsequent exams. Ground-glass nodule within the left lower lobe slightly more prominent than that seen on the prior exam. This could also be evaluated on follow-up imaging. Aortic Atherosclerosis (ICD10-I70.0) and Emphysema (ICD10-J43.9). Electronically Signed   By: Inez Catalina M.D.   On: 01/20/2022 22:54   DG Chest Port 1 View  Result Date: 01/20/2022 CLINICAL DATA:  Weakness and shortness of breath for 1 day EXAM: PORTABLE CHEST 1 VIEW COMPARISON:  09/02/2021 FINDINGS: Cardiac shadow is within normal limits. Aortic calcifications are noted. The lungs are hyperinflated. Nodular density is noted in the right lung base measuring approximately 16 mm. This lies in a similar area to a previously seen right middle lobe ground-glass nodule. Left perihilar nodule is noted in the lower lobe similar to that seen on prior CT. No other focal infiltrate or nodular changes are seen. IMPRESSION: Solid appearing nodule over the right lung base in an area of previous ground-glass nodularity. Right nipple shadow projects in this region as well and may contribute to this nodular  density. Noncontrast CT would be helpful for further clarification. Stable left lower lobe nodule unchanged from the prior CT. Electronically Signed   By: Inez Catalina M.D.   On: 01/20/2022 21:37    Procedures .Critical Care Performed by: Sherwood Gambler, MD Authorized by: Sherwood Gambler, MD   Critical care provider statement:  Critical care time (minutes):  30   Critical care time was exclusive of:  Separately billable procedures and treating other patients   Critical care was necessary to treat or prevent imminent or life-threatening deterioration of the following conditions:  Circulatory failure   Critical care was time spent personally by me on the following activities:  Development of treatment plan with patient or surrogate, discussions with consultants, evaluation of patient's response to treatment, examination of patient, ordering and review of laboratory studies, ordering and review of radiographic studies, ordering and performing treatments and interventions, pulse oximetry, re-evaluation of patient's condition and review of old charts    Medications Ordered in ED Medications  0.9 %  sodium chloride infusion (has no administration in time range)  pantoprazole (PROTONIX) injection 40 mg (has no administration in time range)    ED Course/ Medical Decision Making/ A&P                           Medical Decision Making Amount and/or Complexity of Data Reviewed Independent Historian:     Details: daughter External Data Reviewed: notes. Labs: ordered. Radiology: ordered and independent interpretation performed. ECG/medicine tests: ordered and independent interpretation performed.  Risk Prescription drug management. Decision regarding hospitalization.   Patient is found to have anemia with a hemoglobin of 4.6.  She has been feeling generally weak for some time.  She is not hemodynamically unstable.  Discussed this with patient and daughter and she will need blood transfusion  and I have ordered 2 units to start.  It took a while for her to agree to a rectal exam but after this it does appear that she has somewhat dark stool that was Hemoccult positive.  I have updated the hospitalist, Dr. Josephine Cables.  She will need a GI consult in the morning and I have ordered a dose of IV Protonix.  Otherwise, her other labs are fairly unremarkable.  Anemia panel has been ordered.  Most recent hemoglobin was 9 last years.  Leukopenia seems to be chronic.  I personally viewed the chest x-ray image and there is no pneumonia but based on radiology report we will get a CT noncontrast for the nodule.  Dr. Josephine Cables will admit.  ECG shows no acute ischemia on my interpretation.        Final Clinical Impression(s) / ED Diagnoses Final diagnoses:  Symptomatic anemia    Rx / DC Orders ED Discharge Orders     None         Sherwood Gambler, MD 01/20/22 2339

## 2022-01-20 NOTE — ED Notes (Signed)
ED Provider at bedside. 
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
## 2022-01-20 NOTE — ED Notes (Signed)
X-ray at bedside

## 2022-01-21 ENCOUNTER — Other Ambulatory Visit: Payer: Self-pay

## 2022-01-21 ENCOUNTER — Encounter (HOSPITAL_COMMUNITY): Payer: Self-pay | Admitting: Internal Medicine

## 2022-01-21 DIAGNOSIS — E46 Unspecified protein-calorie malnutrition: Secondary | ICD-10-CM

## 2022-01-21 DIAGNOSIS — R911 Solitary pulmonary nodule: Secondary | ICD-10-CM | POA: Diagnosis present

## 2022-01-21 DIAGNOSIS — D509 Iron deficiency anemia, unspecified: Secondary | ICD-10-CM

## 2022-01-21 DIAGNOSIS — Z539 Procedure and treatment not carried out, unspecified reason: Secondary | ICD-10-CM | POA: Diagnosis not present

## 2022-01-21 DIAGNOSIS — Z7982 Long term (current) use of aspirin: Secondary | ICD-10-CM | POA: Diagnosis not present

## 2022-01-21 DIAGNOSIS — F0390 Unspecified dementia without behavioral disturbance: Secondary | ICD-10-CM | POA: Diagnosis present

## 2022-01-21 DIAGNOSIS — F17210 Nicotine dependence, cigarettes, uncomplicated: Secondary | ICD-10-CM | POA: Diagnosis present

## 2022-01-21 DIAGNOSIS — Z681 Body mass index (BMI) 19 or less, adult: Secondary | ICD-10-CM | POA: Diagnosis not present

## 2022-01-21 DIAGNOSIS — Z9981 Dependence on supplemental oxygen: Secondary | ICD-10-CM | POA: Diagnosis not present

## 2022-01-21 DIAGNOSIS — K317 Polyp of stomach and duodenum: Secondary | ICD-10-CM | POA: Diagnosis present

## 2022-01-21 DIAGNOSIS — K644 Residual hemorrhoidal skin tags: Secondary | ICD-10-CM | POA: Diagnosis not present

## 2022-01-21 DIAGNOSIS — I10 Essential (primary) hypertension: Secondary | ICD-10-CM | POA: Diagnosis present

## 2022-01-21 DIAGNOSIS — E440 Moderate protein-calorie malnutrition: Secondary | ICD-10-CM | POA: Insufficient documentation

## 2022-01-21 DIAGNOSIS — K922 Gastrointestinal hemorrhage, unspecified: Secondary | ICD-10-CM

## 2022-01-21 DIAGNOSIS — F05 Delirium due to known physiological condition: Secondary | ICD-10-CM | POA: Diagnosis present

## 2022-01-21 DIAGNOSIS — Z85118 Personal history of other malignant neoplasm of bronchus and lung: Secondary | ICD-10-CM | POA: Diagnosis not present

## 2022-01-21 DIAGNOSIS — D72819 Decreased white blood cell count, unspecified: Secondary | ICD-10-CM | POA: Diagnosis present

## 2022-01-21 DIAGNOSIS — E782 Mixed hyperlipidemia: Secondary | ICD-10-CM

## 2022-01-21 DIAGNOSIS — E8809 Other disorders of plasma-protein metabolism, not elsewhere classified: Secondary | ICD-10-CM | POA: Diagnosis present

## 2022-01-21 DIAGNOSIS — Z9049 Acquired absence of other specified parts of digestive tract: Secondary | ICD-10-CM | POA: Diagnosis not present

## 2022-01-21 DIAGNOSIS — D500 Iron deficiency anemia secondary to blood loss (chronic): Secondary | ICD-10-CM | POA: Diagnosis not present

## 2022-01-21 DIAGNOSIS — Z79899 Other long term (current) drug therapy: Secondary | ICD-10-CM | POA: Diagnosis not present

## 2022-01-21 DIAGNOSIS — Z881 Allergy status to other antibiotic agents status: Secondary | ICD-10-CM | POA: Diagnosis not present

## 2022-01-21 DIAGNOSIS — D649 Anemia, unspecified: Secondary | ICD-10-CM | POA: Diagnosis present

## 2022-01-21 DIAGNOSIS — Z87442 Personal history of urinary calculi: Secondary | ICD-10-CM | POA: Diagnosis not present

## 2022-01-21 DIAGNOSIS — K3182 Dieulafoy lesion (hemorrhagic) of stomach and duodenum: Secondary | ICD-10-CM | POA: Diagnosis present

## 2022-01-21 DIAGNOSIS — J449 Chronic obstructive pulmonary disease, unspecified: Secondary | ICD-10-CM | POA: Diagnosis present

## 2022-01-21 DIAGNOSIS — Z90710 Acquired absence of both cervix and uterus: Secondary | ICD-10-CM | POA: Diagnosis not present

## 2022-01-21 DIAGNOSIS — J9611 Chronic respiratory failure with hypoxia: Secondary | ICD-10-CM | POA: Diagnosis present

## 2022-01-21 DIAGNOSIS — D62 Acute posthemorrhagic anemia: Secondary | ICD-10-CM | POA: Diagnosis present

## 2022-01-21 DIAGNOSIS — Z885 Allergy status to narcotic agent status: Secondary | ICD-10-CM | POA: Diagnosis not present

## 2022-01-21 LAB — CBC
HCT: 26 % — ABNORMAL LOW (ref 36.0–46.0)
HCT: 26.6 % — ABNORMAL LOW (ref 36.0–46.0)
Hemoglobin: 7.7 g/dL — ABNORMAL LOW (ref 12.0–15.0)
Hemoglobin: 7.9 g/dL — ABNORMAL LOW (ref 12.0–15.0)
MCH: 22.6 pg — ABNORMAL LOW (ref 26.0–34.0)
MCH: 22.7 pg — ABNORMAL LOW (ref 26.0–34.0)
MCHC: 29.6 g/dL — ABNORMAL LOW (ref 30.0–36.0)
MCHC: 29.7 g/dL — ABNORMAL LOW (ref 30.0–36.0)
MCV: 76.5 fL — ABNORMAL LOW (ref 80.0–100.0)
MCV: 76.4 fL — ABNORMAL LOW (ref 80.0–100.0)
Platelets: 195 10*3/uL (ref 150–400)
Platelets: 204 10*3/uL (ref 150–400)
RBC: 3.4 MIL/uL — ABNORMAL LOW (ref 3.87–5.11)
RBC: 3.48 MIL/uL — ABNORMAL LOW (ref 3.87–5.11)
RDW: 17.9 % — ABNORMAL HIGH (ref 11.5–15.5)
RDW: 18.1 % — ABNORMAL HIGH (ref 11.5–15.5)
WBC: 2.1 10*3/uL — ABNORMAL LOW (ref 4.0–10.5)
WBC: 2.9 10*3/uL — ABNORMAL LOW (ref 4.0–10.5)
nRBC: 0 % (ref 0.0–0.2)
nRBC: 0 % (ref 0.0–0.2)

## 2022-01-21 LAB — COMPREHENSIVE METABOLIC PANEL
ALT: 11 U/L (ref 0–44)
AST: 18 U/L (ref 15–41)
Albumin: 3.3 g/dL — ABNORMAL LOW (ref 3.5–5.0)
Alkaline Phosphatase: 55 U/L (ref 38–126)
Anion gap: 6 (ref 5–15)
BUN: 17 mg/dL (ref 8–23)
CO2: 37 mmol/L — ABNORMAL HIGH (ref 22–32)
Calcium: 8.5 mg/dL — ABNORMAL LOW (ref 8.9–10.3)
Chloride: 95 mmol/L — ABNORMAL LOW (ref 98–111)
Creatinine, Ser: 0.86 mg/dL (ref 0.44–1.00)
GFR, Estimated: >60 mL/min (ref >60)
Glucose, Bld: 81 mg/dL (ref 70–99)
Potassium: 3.8 mmol/L (ref 3.5–5.1)
Sodium: 138 mmol/L (ref 135–145)
Total Bilirubin: 0.8 mg/dL (ref 0.3–1.2)
Total Protein: 5.7 g/dL — ABNORMAL LOW (ref 6.5–8.1)

## 2022-01-21 LAB — URINALYSIS, ROUTINE W REFLEX MICROSCOPIC
Bilirubin Urine: NEGATIVE
Glucose, UA: NEGATIVE mg/dL
Hgb urine dipstick: NEGATIVE
Ketones, ur: NEGATIVE mg/dL
Leukocytes,Ua: NEGATIVE
Nitrite: NEGATIVE
Protein, ur: NEGATIVE mg/dL
Specific Gravity, Urine: 1.001 — ABNORMAL LOW (ref 1.005–1.030)
pH: 7 (ref 5.0–8.0)

## 2022-01-21 LAB — PHOSPHORUS: Phosphorus: 3.9 mg/dL (ref 2.5–4.6)

## 2022-01-21 LAB — MAGNESIUM: Magnesium: 1.9 mg/dL (ref 1.7–2.4)

## 2022-01-21 MED ORDER — AMLODIPINE BESYLATE 5 MG PO TABS
10.0000 mg | ORAL_TABLET | Freq: Every day | ORAL | Status: DC
  Administered 2022-01-21 – 2022-01-26 (×5): 10 mg via ORAL
  Filled 2022-01-21 (×5): qty 2

## 2022-01-21 MED ORDER — ENSURE ENLIVE PO LIQD
237.0000 mL | Freq: Two times a day (BID) | ORAL | Status: DC
  Administered 2022-01-22 – 2022-01-23 (×3): 237 mL via ORAL
  Filled 2022-01-21 (×4): qty 237

## 2022-01-21 MED ORDER — FERROUS SULFATE 325 (65 FE) MG PO TABS
325.0000 mg | ORAL_TABLET | ORAL | Status: DC
Start: 2022-01-21 — End: 2022-01-21
  Administered 2022-01-21: 325 mg via ORAL
  Filled 2022-01-21: qty 1

## 2022-01-21 MED ORDER — PNEUMOCOCCAL 20-VAL CONJ VACC 0.5 ML IM SUSY: 0.5000 mL | PREFILLED_SYRINGE | INTRAMUSCULAR | Status: DC

## 2022-01-21 MED ORDER — PANTOPRAZOLE SODIUM 40 MG IV SOLR: 40.0000 mg | Freq: Once | INTRAVENOUS | Status: DC

## 2022-01-21 MED ORDER — HYDRALAZINE HCL 20 MG/ML IJ SOLN: 10.0000 mg | Freq: Four times a day (QID) | INTRAMUSCULAR | Status: DC | PRN

## 2022-01-21 MED ORDER — VITAMIN B-12 1000 MCG PO TABS
1000.0000 ug | ORAL_TABLET | Freq: Every day | ORAL | Status: DC
  Administered 2022-01-21 – 2022-01-26 (×5): 1000 ug via ORAL
  Filled 2022-01-21 (×5): qty 1

## 2022-01-21 MED ORDER — FOLIC ACID 1 MG PO TABS
1.0000 mg | ORAL_TABLET | Freq: Every day | ORAL | Status: DC
  Administered 2022-01-21 – 2022-01-26 (×5): 1 mg via ORAL
  Filled 2022-01-21 (×5): qty 1

## 2022-01-21 MED ORDER — SODIUM CHLORIDE 0.9 % IV SOLN: INTRAVENOUS | Status: DC

## 2022-01-21 MED ORDER — ALBUTEROL SULFATE HFA 108 (90 BASE) MCG/ACT IN AERS: 2.0000 | INHALATION_SPRAY | Freq: Four times a day (QID) | RESPIRATORY_TRACT | Status: DC | PRN

## 2022-01-21 MED ORDER — LORAZEPAM 2 MG/ML IJ SOLN
0.5000 mg | Freq: Four times a day (QID) | INTRAMUSCULAR | Status: DC | PRN
  Administered 2022-01-21: 0.5 mg via INTRAVENOUS
  Filled 2022-01-21: qty 1

## 2022-01-21 MED ORDER — PRAVASTATIN SODIUM 40 MG PO TABS
40.0000 mg | ORAL_TABLET | Freq: Every day | ORAL | Status: DC
  Administered 2022-01-21 – 2022-01-26 (×5): 40 mg via ORAL
  Filled 2022-01-21 (×5): qty 1

## 2022-01-21 MED ORDER — PEG 3350-KCL-NA BICARB-NACL 420 G PO SOLR
4000.0000 mL | Freq: Once | ORAL | Status: AC
  Administered 2022-01-21: 4000 mL via ORAL

## 2022-01-21 MED ORDER — NICOTINE 14 MG/24HR TD PT24
14.0000 mg | MEDICATED_PATCH | Freq: Every day | TRANSDERMAL | Status: DC
Start: 2022-01-21 — End: 2022-01-26
  Administered 2022-01-21 – 2022-01-26 (×6): 14 mg via TRANSDERMAL
  Filled 2022-01-21 (×6): qty 1

## 2022-01-21 MED ORDER — PANTOPRAZOLE INFUSION (NEW) - SIMPLE MED
8.0000 mg/h | INTRAVENOUS | Status: AC
Start: 2022-01-21 — End: 2022-01-24
  Administered 2022-01-21 – 2022-01-23 (×4): 8 mg/h via INTRAVENOUS
  Filled 2022-01-21 (×2): qty 100
  Filled 2022-01-21: qty 80
  Filled 2022-01-21 (×3): qty 100
  Filled 2022-01-21: qty 80
  Filled 2022-01-21 (×2): qty 100

## 2022-01-21 MED ORDER — ATENOLOL 25 MG PO TABS
50.0000 mg | ORAL_TABLET | Freq: Every day | ORAL | Status: DC
  Administered 2022-01-21: 50 mg via ORAL
  Filled 2022-01-21: qty 2

## 2022-01-21 MED ORDER — LORAZEPAM 2 MG/ML IJ SOLN
1.0000 mg | Freq: Four times a day (QID) | INTRAMUSCULAR | Status: DC | PRN
  Administered 2022-01-21: 1 mg via INTRAVENOUS
  Filled 2022-01-21: qty 1

## 2022-01-21 MED ORDER — MOMETASONE FURO-FORMOTEROL FUM 200-5 MCG/ACT IN AERO
2.0000 | INHALATION_SPRAY | Freq: Two times a day (BID) | RESPIRATORY_TRACT | Status: DC
Start: 2022-01-21 — End: 2022-01-26
  Administered 2022-01-21 – 2022-01-26 (×10): 2 via RESPIRATORY_TRACT
  Filled 2022-01-21: qty 8.8

## 2022-01-21 MED ORDER — PANTOPRAZOLE SODIUM 40 MG IV SOLR
40.0000 mg | Freq: Two times a day (BID) | INTRAVENOUS | Status: DC
Start: 2022-01-24 — End: 2022-01-24

## 2022-01-21 NOTE — TOC Progression Note (Signed)
Transition of Care [facility]) - Progression Note    Patient Details  Name: Sarah Rich MRN: [id number] Date of Birth: October 26, 1945  Transition of Care [facility]) CM/SW Contact  Salome Arnt, [place] Phone Number: 01/21/2022, 10:15 AM  Clinical Narrative:   Transition of Care [facility]) Screening Note   Patient Details  Name: Sarah Rich Date of Birth: [birth year]   Transition of Care [facility]) CM/SW Contact:    Salome Arnt, LCSW Phone Number: 01/21/2022, 10:15 AM    Transition of Care Department [facility]) has reviewed patient and no TOC needs have been identified at this time. We will continue to monitor patient advancement through interdisciplinary progression rounds. If new patient transition needs arise, please place a TOC consult.            Expected Discharge Plan and Services                                                 Social Determinants of Health (SDOH) Interventions    Readmission Risk Interventions     View : No data to display.

## 2022-01-21 NOTE — Anesthesia Preprocedure Evaluation (Addendum)
Anesthesia Evaluation  Patient identified by MRN, date of birth, ID band Patient awake    Reviewed: Allergy & Precautions, NPO status , Patient's Chart, lab work & pertinent test results, reviewed documented beta blocker date and time   Airway Mallampati: II  TM Distance: >3 FB Neck ROM: Full    Dental  (+) Dental Advisory Given, Missing   Pulmonary shortness of breath, with exertion and Long-Term Oxygen Therapy, asthma , COPD,  oxygen dependent, Current Smoker,  Left lung cancer    + decreased breath sounds+ wheezing      Cardiovascular Exercise Tolerance: Poor hypertension, Pt. on medications and Pt. on home beta blockers + DOE  Normal cardiovascular exam Rhythm:Regular Rate:Normal     Neuro/Psych negative neurological ROS  negative psych ROS   GI/Hepatic negative GI ROS, Neg liver ROS, GI bleeding   Endo/Other  negative endocrine ROS  Renal/GU Renal disease  negative genitourinary   Musculoskeletal negative musculoskeletal ROS (+)   Abdominal   Peds negative pediatric ROS (+)  Hematology negative hematology ROS (+) Blood dyscrasia, anemia ,   Anesthesia Other Findings   Reproductive/Obstetrics negative OB ROS                            Anesthesia Physical Anesthesia Plan  ASA: 4  Anesthesia Plan: General   Post-op Pain Management: Minimal or no pain anticipated   Induction: Intravenous  PONV Risk Score and Plan: Propofol infusion  Airway Management Planned: Nasal Cannula, Natural Airway and Simple Face Mask  Additional Equipment:   Intra-op Plan:   Post-operative Plan: Possible Post-op intubation/ventilation  Informed Consent: I have reviewed the patients History and Physical, chart, labs and discussed the procedure including the risks, benefits and alternatives for the proposed anesthesia with the patient or authorized representative who has indicated his/her  understanding and acceptance.       Plan Discussed with: CRNA and Surgeon  Anesthesia Plan Comments: (Patient is wheezing, will reevaluate her pulmonary status tomorrow. )      Anesthesia Quick Evaluation

## 2022-01-21 NOTE — ED Notes (Signed)
Lab unable to do blood draw until after blood finishes. Lab will come back around 0700

## 2022-01-21 NOTE — ED Notes (Signed)
Pt given cola to drink

## 2022-01-21 NOTE — Progress Notes (Signed)
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   GOAL:  Patient will meet greater than or equal to 90% of their needs   MONITOR:  Diet advancement, Labs, PO intake, Supplement acceptance, Weight trends  REASON FOR ASSESSMENT:   Malnutrition Screening Tool    ASSESSMENT: patient is a 76 yo female with hx of COPD, Lung cancer, HTN, Chronic respiratory failure (O2-@5 -6 L). Presents with weakness. Acute anemia 2 units PRBC.   Patient is NPO. Scheduled for EGD and colonoscopy tomorrow. Patient son and daughter in law are present and help with much of her care- such as shopping for food, bringing in meals etc. Patient reports I eat when I want to but is limited in recalling exactly what she is eating. Family recalls that patient has been eating less and less. Patient will ask for food then only eat a few bites and is full. Able to feed herself.   Patient doesn't drive but has been walking in the house to the bathroom, kitchen. She is able to use microwave and sometimes will eat [employer] pies. Family has been bringing food but often patient is not hungry. At one time she was drinking Ensure but has not lately.   Weights - down 3 kg (5%) x 3 months -and 7 kg (13%) loss x 10 months. Not significant for time range but undesirable. Edema noted moderate right ankle /foot. Mild edema left foot. Family says LE look better today.   Medications: Vitamin C-16, folic acid, Nicoderm, Protonix.   Labs:     Latest  Ref Rng & Units 01/21/2022    7:03 AM 01/20/2022    9:02 PM 09/17/2020   10:31 AM  BMP  Glucose 70 - 99 mg/dL 81   108   92    BUN 8 - 23 mg/dL 17   18   15     Creatinine 0.44 - 1.00 mg/dL 0.86   0.98   0.80    Sodium 135 - 145 mmol/L 138   138   137    Potassium 3.5 - 5.1 mmol/L 3.8   3.8   3.9    Chloride 98 - 111 mmol/L 95   93   99    CO2 22 - 32 mmol/L 37   34   31    Calcium 8.9 - 10.3 mg/dL 8.5   8.6   8.8        NUTRITION - FOCUSED PHYSICAL EXAM:  Flowsheet Row Most Recent Value  Orbital Region Mild depletion  Upper Arm Region Moderate depletion  Buccal Region Moderate depletion  Temple Region Mild depletion  Clavicle Bone Region Moderate depletion  Clavicle and Acromion Bone Region Moderate depletion  Scapular Bone Region Moderate depletion  Dorsal Hand Severe depletion  Patellar Region Moderate depletion  Edema (RD Assessment) --  [mild left/ moderate right LE]  Hair Reviewed  Eyes Reviewed  Mouth Reviewed  Skin Reviewed  Nails Reviewed       Diet Order:   Diet Order  Diet NPO time specified  Diet effective now                   EDUCATION NEEDS:  Education needs have been addressed  Skin:  Skin Assessment: Reviewed RN Assessment  Last BM:  prior to admission  Height:   Ht Readings from Last 1 Encounters:  01/06/22 5\' 6"  (1.676 m)    Weight:   Wt Readings from Last 1 Encounters:  01/06/22 52.1 kg    Ideal Body Weight:   65 kg  BMI:  There is no height or weight on file to calculate BMI.  Estimated Nutritional Needs:   Kcal:  1368-5992  Protein:  68-73 gr  Fluid:  1500-1600 ml daily  Colman Cater MS,RD,CSG,LDN Contact: Shea Evans

## 2022-01-21 NOTE — Consult Note (Signed)
Gastroenterology Consult   Referring Provider: No ref. provider found Primary Care Physician:  Lemmie Evens, MD Primary Gastroenterologist:  Dr. Oneida Alar previously  Patient ID: Buel Ream; [id number]; Jan 07, 1946   Admit date: 01/20/2022  LOS: 0 days   Date of Consultation: 01/21/2022  Reason for Consultation:  symptomatic anemia  History of Present Illness   Sarah Rich is a 76 y.o. year old female with a history of asthma, COPD, HLD, HTN, lung cancer with chronic respiratory failure with hypoxia on 5 to 6 L/min of supplemental oxygen.  She presented to the ED with 6 weeks of progressive weakness and bilateral lower extremity swelling in the setting of stopping her every 3-day Lasix regimen.   ED course: Tachypneic, temperature 97.5 F, oxygen saturation 100% on 4 L nasal cannula. Labs -WBC 2.2, hemoglobin 4.6, MCV 72.8, BMP mostly WNL, B12 WNL, folate 5.4, iron 13, saturation 3%, ferritin 3 Hemoccult positive CT chest shows slight increase in solid component and a groundglass nodule in the right middle lobe. Started on IV Protonix drip, transfused with 2 units PRBC   Consult: Last colonoscopy September 2013 -2 sessile polyps 3 to 5 mm size in ascending colon and rectum, mild diverticulosis in the sigmoid, otherwise normal.  Path revealed tubular adenomas.  Due for repeat this year  Patient denies abdominal pain, reflux symptoms, dysphagia, melena, hematochezia, hematemesis, nausea, or vomiting. She is an everyday smoker and does admit to having some constipation and needing to strain from time to time, also possibly some intermittent toilet tissue hematochezia. The patient and her son/daughter in law also report that she eats like a bird and does not have much of an appetite on most days. She used to in the past drink some protein shakes such as ensure or boost but not recently. Family reports about an 8 lb weight loss in a 2 month time frame. The patient states she does not  take any medications that she does not have to therefore she may only take one aleve about once a month. She does still complain of some shortness of breath and weakness that has been more progressive over the last couple of months, she is on about 3-5L nasal cannula at baseline at home. She denies any chest pain.   Past Medical History:  Diagnosis Date   Asthma    Cancer ([place])    assume lung ca/considering empiric radiation   COPD (chronic obstructive pulmonary disease) ([place])    Hypercholesteremia    Hypertension    Kidney stones    Shortness of breath     Past Surgical History:  Procedure Laterality Date   ABDOMINAL HYSTERECTOMY     BRONCHIAL WASHINGS  05/02/2018   Procedure: BRONCHIAL WASHINGS;  Surgeon: Sinda Du, MD;  [facility]: [facility];  Service: Cardiopulmonary;;   CHOLECYSTECTOMY     COLONOSCOPY  05/28/2012   Procedure: COLONOSCOPY;  Surgeon: Danie Binder, MD;  Location: [facility];  Service: Endoscopy;  Laterality: N/A;  9:45 AM   EXPLORATORY LAPAROTOMY     FLEXIBLE BRONCHOSCOPY Bilateral 05/02/2018   Procedure: FLEXIBLE BRONCHOSCOPY;  Surgeon: Sinda Du, MD;  Location: [facility];  Service: Cardiopulmonary;  Laterality: Bilateral;   right breast cystectomy  1980   benign    Prior to Admission medications   Medication Sig Start Date End Date Taking? Authorizing Provider  acetylcysteine (MUCOMYST) 20 % nebulizer solution Take 3 mLs by nebulization 4 (four) times daily. 05/03/18   Kathie Dike, MD  albuterol (  PROVENTIL HFA;VENTOLIN HFA) 108 (90 BASE) MCG/ACT inhaler Inhale 2 puffs into the lungs every 6 (six) hours as needed. Shortness of Breath    [provider]  albuterol (PROVENTIL) (2.5 MG/3ML) 0.083% nebulizer solution Take 3 mLs (2.5 mg total) by nebulization 4 (four) times daily. 05/03/18   Kathie Dike, MD  aspirin 81 MG tablet Take 81 mg by mouth daily.    [provider]  atenolol (TENORMIN) 50 MG tablet Take 50 mg by  mouth daily.    [provider]  cholecalciferol (VITAMIN D) 1000 UNITS tablet Take 1,000 Units by mouth daily.    [provider]  diazepam (VALIUM) 2 MG tablet Take 2 mg by mouth every 6 (six) hours as needed for anxiety.    [provider]  fluticasone (FLONASE) 50 MCG/ACT nasal spray Place 1-2 sprays into both nostrils daily. 08/30/17   [provider]  Fluticasone-Umeclidin-Vilant (TRELEGY ELLIPTA) 100-62.5-25 MCG/ACT AEPB INHALE 1 PUFF INTO LUNGS ONCE DAILY 10/11/21   Rigoberto Noel, MD  furosemide (LASIX) 40 MG tablet Take 40 mg by mouth daily as needed. 02/18/18   [provider]  potassium chloride (KLOR-CON) 20 MEQ packet Take 20 mEq by mouth once.    [provider]  pravastatin (PRAVACHOL) 40 MG tablet Take 40 mg by mouth daily.    [provider]  vitamin B-12 (CYANOCOBALAMIN) 1000 MCG tablet Take 1,000 mcg by mouth daily.    [provider]    Current Facility-Administered Medications  Medication Dose Route Frequency Provider Last Rate Last Admin   acetaminophen (TYLENOL) tablet 650 mg  650 mg Oral Q6H PRN Adefeso, Oladapo, DO       Or   acetaminophen (TYLENOL) suppository 650 mg  650 mg Rectal Q6H PRN Adefeso, Oladapo, DO       albuterol (VENTOLIN HFA) 108 (90 Base) MCG/ACT inhaler 2 puff  2 puff Inhalation Q6H PRN Adefeso, Oladapo, DO       atenolol (TENORMIN) tablet 50 mg  50 mg Oral Daily Adefeso, Oladapo, DO       feeding supplement (ENSURE ENLIVE / ENSURE PLUS) liquid 237 mL  237 mL Oral BID BM Adefeso, Oladapo, DO       ferrous sulfate tablet 325 mg  325 mg Oral QODAY Adefeso, Oladapo, DO       folic acid (FOLVITE) tablet 1 mg  1 mg Oral Daily Adefeso, Oladapo, DO       [START ON 01/24/2022] pantoprazole (PROTONIX) injection 40 mg  40 mg Intravenous Q12H Adefeso, Oladapo, DO       pantoprazole (PROTONIX) injection 40 mg  40 mg Intravenous Once Adefeso, Oladapo, DO       pantoprozole (PROTONIX) 80 mg /NS  100 mL infusion  8 mg/hr Intravenous Continuous Adefeso, Oladapo, DO 10 mL/hr at 01/21/22 0417 8 mg/hr at 01/21/22 0417   [START ON 01/22/2022] pneumococcal 20-valent conjugate vaccine (PREVNAR 20) injection 0.5 mL  0.5 mL Intramuscular Tomorrow-1000 Emokpae, Courage, MD       pravastatin (PRAVACHOL) tablet 40 mg  40 mg Oral Daily Adefeso, Oladapo, DO       vitamin B-12 (CYANOCOBALAMIN) tablet 1,000 mcg  1,000 mcg Oral Daily Adefeso, Oladapo, DO        Allergies as of 01/20/2022 - Review Complete 01/20/2022  Allergen Reaction Noted   Sulfa antibiotics Other (See Comments) 04/24/2012   Codeine  10/23/2015   Levaquin [levofloxacin] Other (See Comments) 05/16/2012    Family History  Problem Relation Age of  Onset   Colon cancer Neg Hx    Breast cancer Neg Hx    Pancreatic cancer Neg Hx    Cancer Neg Hx     Social History   Socioeconomic History   Marital status: Widowed    Spouse name: Not on file   Number of children: 1   Years of education: Not on file   Highest education level: Not on file  Occupational History   Not on file  Tobacco Use   Smoking status: Every Day    Packs/day: 0.25    Years: 50.00    Pack years: 12.50    Types: Cigarettes   Smokeless tobacco: Never   Tobacco comments:    smoke 3-4 cigarettes per day [facility] 03/10/2021  Vaping Use   Vaping Use: Never used  Substance and Sexual Activity   Alcohol use: Yes    Alcohol/week: 3.0 standard drinks    Types: 3 Standard drinks or equivalent per week   Drug use: No   Sexual activity: Not Currently  Other Topics Concern   Not on file  Social History Narrative   Not on file   Social Determinants of Health   Financial Resource Strain: Not on file  Food Insecurity: Not on file  Transportation Needs: Not on file  Physical Activity: Not on file  Stress: Not on file  Social Connections: Not on file  Intimate Partner Violence: Not on file     Review of Systems   Gen: + weight loss and lack of appetite.  Denies any fever, chills. CV: Denies chest pain, heart palpitations, syncope, edema  Resp: + shortness of breath. Denies cough, wheezing, coughing up blood, and pleurisy. GI: see HPI GU : Denies urinary burning, blood in urine, urinary frequency, and urinary incontinence. MS: Denies joint pain, limitation of movement, swelling, cramps, and atrophy.  Derm: Denies rash, itching, dry skin, hives. Psych: Denies depression, anxiety, memory loss, hallucinations, and confusion. Heme: Denies bruising or bleeding Neuro:  Denies any headaches, dizziness, paresthesias, shaking  Physical Exam   Vital Signs in last 24 hours: Temp:  [97.5 F (36.4 C)-98.3 F (36.8 C)] 97.7 F (36.5 C) (05/19 0700) Pulse Rate:  [61-79] 72 (05/19 0700) Resp:  [12-29] 18 (05/19 0700) BP: (142-181)/(54-131) 168/76 (05/19 0700) SpO2:  [100 %] 100 % (05/19 0700)    General:   Alert,  Well-developed, well-nourished, pleasant and cooperative in NAD Head:  Normocephalic and atraumatic. Eyes:  Sclera clear, no icterus.   Conjunctiva pink. Ears:  Normal auditory acuity Lungs:  Clear throughout to auscultation.   No wheezes, crackles, or rhonchi. No acute distress. Heart:  Regular rate and rhythm; no murmurs, clicks, rubs,  or gallops. Abdomen:  Soft, nontender and nondistended. No masses, hepatosplenomegaly or hernias noted. Normal bowel sounds, without guarding, and without rebound.   Rectal:  deferred Msk:  Symmetrical without gross deformities. Normal posture. Extremities:  Without clubbing or edema. Neurologic:  Alert and  oriented x4. Skin:  Intact without significant lesions or rashes. Psych:  Alert and cooperative. Normal mood and affect.  Intake/Output from previous day: 05/18 0701 - 05/19 0700 In: 627.4 [I.V.:29.4; Blood:598] Out: -  Intake/Output this shift: No intake/output data recorded.   Labs/Studies   Recent Labs Recent Labs    01/20/22 2102 01/21/22 0703  WBC 2.2* 2.1*  HGB 4.6* 7.7*   HCT 17.7* 26.0*  PLT 216 195   BMET Recent Labs    01/20/22 2102 01/21/22 0703  NA 138 138  K  3.8 3.8  CL 93* 95*  CO2 34* 37*  GLUCOSE 108* 81  BUN 18 17  CREATININE 0.98 0.86  CALCIUM 8.6* 8.5*   LFT Recent Labs    01/20/22 2102 01/21/22 0703  PROT 5.8* 5.7*  ALBUMIN 3.3* 3.3*  AST 18 18  ALT 10 11  ALKPHOS 52 55  BILITOT 0.3 0.8  BILIDIR 0.1  --   IBILI 0.2*  --    PT/INR No results for input(s): LABPROT, INR in the last 72 hours. Hepatitis Panel No results for input(s): HEPBSAG, HCVAB, HEPAIGM, HEPBIGM in the last 72 hours. C-Diff No results for input(s): CDIFFTOX in the last 72 hours.  Radiology/Studies CT Chest Wo Contrast  Result Date: 01/20/2022 CLINICAL DATA:  Evaluate lung nodule seen on recent chest x-ray EXAM: CT CHEST WITHOUT CONTRAST TECHNIQUE: Multidetector CT imaging of the chest was performed following the standard protocol without IV contrast. RADIATION DOSE REDUCTION: This exam was performed according to the departmental dose-optimization program which includes automated exposure control, adjustment of the mA and/or kV according to patient size and/or use of iterative reconstruction technique. COMPARISON:  Chest x-ray from earlier in the same day as well as CT from 09/02/2021 FINDINGS: Cardiovascular: Somewhat limited due to lack of IV contrast. Atherosclerotic calcifications are noted of the thoracic aorta. No aneurysmal dilatation is seen. Heavy coronary calcifications are noted. No cardiac enlargement is seen. Cardiac blood pool is decreased in attenuation which may be related to underlying anemia. Mediastinum/Nodes: Thoracic inlet is within normal limits. No hilar or mediastinal adenopathy is noted. The esophagus is within normal limits. Lungs/Pleura: Lungs are well aerated bilaterally. The previously seen nodule in the left lower lobe is again seen and stable in appearance. Some persistent traction upon the fissure is seen but stable. 17 mm nodule is  noted along the posterior aspect of the pericardium in the medial aspect of the left lobe of the liver best seen on image number 125 of series 4. This is stable in appearance from the prior exam. Ground-glass nodule is noted in the posterior left lower lobe best seen on image number 106 of series 4 measuring up to 14 mm. This is slightly larger than that seen on the prior exam and may be related to technical factors. The ground-glass nodule seen previously in the right middle lobe is again identified measuring up to 18 mm. The solid component however appears more prominent now measuring up to 6 mm. No other significant nodular changes in the right lung are noted. Diffuse emphysematous changes are noted throughout both lungs. No effusion is noted. Upper Abdomen: Visualized upper abdomen shows no acute abnormality. Musculoskeletal: No rib abnormality is noted. Degenerative changes of the thoracic spine are seen. No acute abnormality is noted. IMPRESSION: Slight increase in the solid component in a ground-glass nodule in the right middle lobe. A portion of this corresponds to the changes on recent chest x-ray although the majority of the density is felt to be related to the overlying nipple shadow given its location. Given the increase in solid component however, PET-CT is recommended for further evaluation. Stable nodules in the left lower lobe, one superiorly with pleural retraction and a second along the posterior aspect of the pericardium. These can also be followed on subsequent exams. Ground-glass nodule within the left lower lobe slightly more prominent than that seen on the prior exam. This could also be evaluated on follow-up imaging. Aortic Atherosclerosis (ICD10-I70.0) and Emphysema (ICD10-J43.9). Electronically Signed   By: Inez Catalina  M.D.   On: 01/20/2022 22:54   DG Chest Port 1 View  Result Date: 01/20/2022 CLINICAL DATA:  Weakness and shortness of breath for 1 day EXAM: PORTABLE CHEST 1 VIEW  COMPARISON:  09/02/2021 FINDINGS: Cardiac shadow is within normal limits. Aortic calcifications are noted. The lungs are hyperinflated. Nodular density is noted in the right lung base measuring approximately 16 mm. This lies in a similar area to a previously seen right middle lobe ground-glass nodule. Left perihilar nodule is noted in the lower lobe similar to that seen on prior CT. No other focal infiltrate or nodular changes are seen. IMPRESSION: Solid appearing nodule over the right lung base in an area of previous ground-glass nodularity. Right nipple shadow projects in this region as well and may contribute to this nodular density. Noncontrast CT would be helpful for further clarification. Stable left lower lobe nodule unchanged from the prior CT. Electronically Signed   By: Inez Catalina M.D.   On: 01/20/2022 21:37     Assessment   Sarah Rich is a 76 y.o. year old female with a history of asthma, COPD, HLD, HTN, lung cancer with chronic respiratory failure with hypoxia on 5 to 6 L/min of supplemental oxygen.  She presented to the ED with 6 weeks of progressive weakness and bilateral lower extremity swelling in the setting of stopping her every 3-day Lasix regimen.  She was found to be profound symptomatic IDA.  GI consulted for further evaluation.  Symptomatic anemia/IDA: Hgb 4.6 on admission with microcytic indices, improved to 7.7 after 2 units PRBC.  Per chart review she has had normal hemoglobin as of August 2019, hemoglobin was 9 in January 2022 with normal MCV.  Anemia panel revealed iron 13, saturation 3%, ferritin 3.  Hemoccult in the ED was positive. She reported progressive shortness of breath and weakness over the last couple of months to where she gets winded walking to the bathroom. Denies frequent NSAID use but is a chronic smoker. Also has some constipation and hemorrhoids. She does have weight loss and lack of appetite.She ha never had an EGD and denies overt reflux symptoms.  Abdominal exam benign. Last colonoscopy in 2013 with 2 small tubular adenoma polyps. Will proceed with EGD and colonoscopy for further evaluation.    I have discussed the risks, alternatives, benefits with regards to but not limited to the risk of reaction to medication, bleeding, infection, perforation and the patient is agreeable to proceed. Written consent to be obtained.    Plan / Recommendations   EGD and colonoscopy with propofol tomorrow 5/20 with Dr. Laural Golden  Colon Prep today Clear liquids, npo at midnight Continue PPI infusion Continue to monitor CBC, transfuse for Hgb < 7 Recommend folic acid supplmentation     01/21/2022, 8:16 AM  Venetia Night, MSN, FNP-BC, AGACNP-BC [facility]

## 2022-01-21 NOTE — Plan of Care (Signed)

## 2022-01-21 NOTE — ED Notes (Signed)
Lab at bedside

## 2022-01-21 NOTE — Progress Notes (Signed)
PROGRESS NOTE     Sarah Rich, is a 76 y.o. female, DOB - 03/13/1946, GUR:[id number]  Admit date - 01/20/2022   Admitting Physician Rosalba Totty Denton Brick, MD  Outpatient Primary MD for the patient is Lemmie Evens, MD  LOS - 0  Chief Complaint  Patient presents with   Weakness       Brief Narrative: 76 y.o. female with medical history significant of COPD, hyperlipidemia, hypertension, chronic respiratory failure with hypoxia on supplemental oxygen via [place] at 5-6 L/min, lung cancer admitted on 01/20/2022 with acute symptomatic anemia with hemoglobin down to 4.6 from a baseline usually between 13 and 14    -Assessment and Plan: 1) acute Severe symptomatic microcytic anemia--- admission HGB 4.6, Hgb is up to 7.7 after transfusion of 2 units of PRBC -Suspect this is due to acute blood loss anemia in the setting of GI bleed -Hemoccult positive -GI consult appreciated -Continue IV Protonix -EGD and colonoscopy on 01/22/2022 after appropriate colon prep Dyspnea improving after transfusion  2)Pulmonary Nodule--- patient with prior history of lung cancer outpatient PET scan advised to rule out underlying malignancy  3) chronic hypoxic respiratory failure--- prior history of lung cancer, COPD and ongoing tobacco use -Continue supplemental oxygen --- currently weaned down to 3 L of oxygen via nasal cannula after transfusion -Continue bronchodilators  4)HTN--stop atenolol given significant COPD, use amlodipine instead  may use IV Hydralazine 10 mg  Every 4 hours Prn for systolic blood pressure over 160 mmhg  5)HLD--- continue pravastatin  6) social/ethics-----plan of care and advanced directives discussed with patient and son Simona Huh, patient's grand-daughter-in-law Nira Conn and patient's daughter-in-law Joaquim Lai -Patient remains a full code without limitations to treatment  Disposition/Need for in-Hospital Stay- patient unable to be discharged at this time due to --- severe symptomatic anemia  requiring transfusion and endoluminal evaluation as well as IV Protonix  Status is: Inpatient   Disposition: The patient is from: Home              Anticipated d/c is to: Home              Anticipated d/c date is: 2 days              Patient currently is not medically stable to d/c. Barriers: Not Clinically Stable-   Code Status :  -  Code Status: Full Code   Family Communication:     (patient is alert, awake and coherent)  -Discussed with patient and son Simona Huh, patient's grand-daughter-in-law Nira Conn and patient's daughter-in-law Joaquim Lai  DVT Prophylaxis  :   - SCDs  SCDs Start: 01/20/22 2318   Lab Results  Component Value Date   PLT 195 01/21/2022    Inpatient Medications  Scheduled Meds:  atenolol  50 mg Oral Daily   feeding supplement  237 mL Oral BID BM   ferrous sulfate  325 mg Oral QODAY   folic acid  1 mg Oral Daily   mometasone-formoterol  2 puff Inhalation BID   nicotine  14 mg Transdermal Daily   [START ON 01/24/2022] pantoprazole  40 mg Intravenous Q12H   pantoprazole (PROTONIX) IV  40 mg Intravenous Once   [START ON 01/22/2022] pneumococcal 20-valent conjugate vaccine  0.5 mL Intramuscular Tomorrow-1000   polyethylene glycol-electrolytes  4,000 mL Oral Once   pravastatin  40 mg Oral Daily   vitamin B-12  1,000 mcg Oral Daily   Continuous Infusions:  sodium chloride 20 mL/hr at 01/21/22 1353   pantoprazole 8 mg/hr (01/21/22 1053)   PRN  Meds:.acetaminophen **OR** acetaminophen, albuterol   Anti-infectives (From admission, onward)    None      Subjective: Adhira Worthey today has no fevers, no emesis,  No chest pain,   -Dyspnea improving after transfusion of PRBC  Patient's son Simona Huh, patient's grand-daughter-in-law Nira Conn and patient's daughter-in-law Joaquim Lai at bedside, questions answered  Objective: Vitals:   01/21/22 0630 01/21/22 0646 01/21/22 0700 01/21/22 1327  BP: (!) 174/76  (!) 168/76 (!) 169/93  Pulse: 63  72 (!) 58  Resp: 17  18    Temp:  97.9 F (36.6 C) 97.7 F (36.5 C) 97.9 F (36.6 C)  TempSrc:  Oral Axillary Oral  SpO2: 100%  100% 100%    Intake/Output Summary (Last 24 hours) at 01/21/2022 1355 Last data filed at 01/21/2022 0531 Gross per 24 hour  Intake 627.42 ml  Output --  Net 627.42 ml   There were no vitals filed for this visit.  Physical Exam  Gen:- Awake Alert, no conversational dyspnea HEENT:- [place].AT, No sclera icterus Nose- [place] 3 L/min Neck-Supple Neck,No JVD,.  Lungs-fair air movement, no wheezing CV- S1, S2 normal, regular  Abd-  +ve B.Sounds, Abd Soft, No tenderness,    Extremity/Skin:- No  edema, pedal pulses present  Psych-some memory and cognitive challenges at baseline, neuro-no new focal deficits, no tremors  Data Reviewed: I have personally reviewed following labs and imaging studies  CBC: Recent Labs  Lab 01/20/22 2102 01/21/22 0703  WBC 2.2* 2.1*  NEUTROABS 1.6*  --   HGB 4.6* 7.7*  HCT 17.7* 26.0*  MCV 72.8* 76.5*  PLT 216 811   Basic Metabolic Panel: Recent Labs  Lab 01/20/22 2102 01/21/22 0703  NA 138 138  K 3.8 3.8  CL 93* 95*  CO2 34* 37*  GLUCOSE 108* 81  BUN 18 17  CREATININE 0.98 0.86  CALCIUM 8.6* 8.5*  MG  --  1.9  PHOS  --  3.9   GFR: CrCl cannot be calculated (Unknown ideal weight.). Liver Function Tests: Recent Labs  Lab 01/20/22 2102 01/21/22 0703  AST 18 18  ALT 10 11  ALKPHOS 52 55  BILITOT 0.3 0.8  PROT 5.8* 5.7*  ALBUMIN 3.3* 3.3*   Cardiac Enzymes: No results for input(s): CKTOTAL, CKMB, CKMBINDEX, TROPONINI in the last 168 hours. BNP (last 3 results) No results for input(s): PROBNP in the last 8760 hours. HbA1C: No results for input(s): HGBA1C in the last 72 hours. Sepsis Labs: @LABRCNTIP (procalcitonin:4,lacticidven:4) )No results found for this or any previous visit (from the past 240 hour(s)).    Radiology Studies: CT Chest Wo Contrast  Result Date: 01/20/2022 CLINICAL DATA:  Evaluate lung nodule seen on recent  chest x-ray EXAM: CT CHEST WITHOUT CONTRAST TECHNIQUE: Multidetector CT imaging of the chest was performed following the standard protocol without IV contrast. RADIATION DOSE REDUCTION: This exam was performed according to the departmental dose-optimization program which includes automated exposure control, adjustment of the mA and/or kV according to patient size and/or use of iterative reconstruction technique. COMPARISON:  Chest x-ray from earlier in the same day as well as CT from 09/02/2021 FINDINGS: Cardiovascular: Somewhat limited due to lack of IV contrast. Atherosclerotic calcifications are noted of the thoracic aorta. No aneurysmal dilatation is seen. Heavy coronary calcifications are noted. No cardiac enlargement is seen. Cardiac blood pool is decreased in attenuation which may be related to underlying anemia. Mediastinum/Nodes: Thoracic inlet is within normal limits. No hilar or mediastinal adenopathy is noted. The esophagus is within normal limits. Lungs/Pleura: Lungs  are well aerated bilaterally. The previously seen nodule in the left lower lobe is again seen and stable in appearance. Some persistent traction upon the fissure is seen but stable. 17 mm nodule is noted along the posterior aspect of the pericardium in the medial aspect of the left lobe of the liver best seen on image number 125 of series 4. This is stable in appearance from the prior exam. Ground-glass nodule is noted in the posterior left lower lobe best seen on image number 106 of series 4 measuring up to 14 mm. This is slightly larger than that seen on the prior exam and may be related to technical factors. The ground-glass nodule seen previously in the right middle lobe is again identified measuring up to 18 mm. The solid component however appears more prominent now measuring up to 6 mm. No other significant nodular changes in the right lung are noted. Diffuse emphysematous changes are noted throughout both lungs. No effusion is noted.  Upper Abdomen: Visualized upper abdomen shows no acute abnormality. Musculoskeletal: No rib abnormality is noted. Degenerative changes of the thoracic spine are seen. No acute abnormality is noted. IMPRESSION: Slight increase in the solid component in a ground-glass nodule in the right middle lobe. A portion of this corresponds to the changes on recent chest x-ray although the majority of the density is felt to be related to the overlying nipple shadow given its location. Given the increase in solid component however, PET-CT is recommended for further evaluation. Stable nodules in the left lower lobe, one superiorly with pleural retraction and a second along the posterior aspect of the pericardium. These can also be followed on subsequent exams. Ground-glass nodule within the left lower lobe slightly more prominent than that seen on the prior exam. This could also be evaluated on follow-up imaging. Aortic Atherosclerosis (ICD10-I70.0) and Emphysema (ICD10-J43.9). Electronically Signed   By: Inez Catalina M.D.   On: 01/20/2022 22:54   DG Chest Port 1 View  Result Date: 01/20/2022 CLINICAL DATA:  Weakness and shortness of breath for 1 day EXAM: PORTABLE CHEST 1 VIEW COMPARISON:  09/02/2021 FINDINGS: Cardiac shadow is within normal limits. Aortic calcifications are noted. The lungs are hyperinflated. Nodular density is noted in the right lung base measuring approximately 16 mm. This lies in a similar area to a previously seen right middle lobe ground-glass nodule. Left perihilar nodule is noted in the lower lobe similar to that seen on prior CT. No other focal infiltrate or nodular changes are seen. IMPRESSION: Solid appearing nodule over the right lung base in an area of previous ground-glass nodularity. Right nipple shadow projects in this region as well and may contribute to this nodular density. Noncontrast CT would be helpful for further clarification. Stable left lower lobe nodule unchanged from the prior CT.  Electronically Signed   By: Inez Catalina M.D.   On: 01/20/2022 21:37     Scheduled Meds:  atenolol  50 mg Oral Daily   feeding supplement  237 mL Oral BID BM   ferrous sulfate  325 mg Oral QODAY   folic acid  1 mg Oral Daily   mometasone-formoterol  2 puff Inhalation BID   nicotine  14 mg Transdermal Daily   [START ON 01/24/2022] pantoprazole  40 mg Intravenous Q12H   pantoprazole (PROTONIX) IV  40 mg Intravenous Once   [START ON 01/22/2022] pneumococcal 20-valent conjugate vaccine  0.5 mL Intramuscular Tomorrow-1000   polyethylene glycol-electrolytes  4,000 mL Oral Once   pravastatin  40  mg Oral Daily   vitamin B-12  1,000 mcg Oral Daily   Continuous Infusions:  sodium chloride 20 mL/hr at 01/21/22 1353   pantoprazole 8 mg/hr (01/21/22 1053)     LOS: 0 days   Roxan Hockey M.D on 01/21/2022 at 1:55 PM  Go to [email or web address] - for contact info  [facility] - Office  [phone]  If 7PM-7AM, please contact night-coverage [email or web address] Password [email or web address] 01/21/2022, 1:55 PM

## 2022-01-22 ENCOUNTER — Inpatient Hospital Stay (HOSPITAL_COMMUNITY): Payer: Medicare Other | Admitting: Anesthesiology

## 2022-01-22 ENCOUNTER — Other Ambulatory Visit: Payer: Self-pay

## 2022-01-22 ENCOUNTER — Encounter (HOSPITAL_COMMUNITY): Payer: Self-pay | Admitting: Family Medicine

## 2022-01-22 ENCOUNTER — Encounter (HOSPITAL_COMMUNITY)
Admission: EM | Disposition: A | Discharge status: Skilled Nursing Facility | Payer: Self-pay | Source: Home / Self Care | Attending: Family Medicine

## 2022-01-22 DIAGNOSIS — K3182 Dieulafoy lesion (hemorrhagic) of stomach and duodenum: Secondary | ICD-10-CM | POA: Diagnosis not present

## 2022-01-22 DIAGNOSIS — K644 Residual hemorrhoidal skin tags: Secondary | ICD-10-CM | POA: Diagnosis not present

## 2022-01-22 DIAGNOSIS — K922 Gastrointestinal hemorrhage, unspecified: Secondary | ICD-10-CM | POA: Diagnosis not present

## 2022-01-22 DIAGNOSIS — Z539 Procedure and treatment not carried out, unspecified reason: Secondary | ICD-10-CM | POA: Diagnosis not present

## 2022-01-22 DIAGNOSIS — D500 Iron deficiency anemia secondary to blood loss (chronic): Secondary | ICD-10-CM | POA: Diagnosis not present

## 2022-01-22 DIAGNOSIS — D649 Anemia, unspecified: Secondary | ICD-10-CM | POA: Diagnosis not present

## 2022-01-22 HISTORY — PX: FLEXIBLE SIGMOIDOSCOPY: SHX5431

## 2022-01-22 HISTORY — PX: ESOPHAGOGASTRODUODENOSCOPY (EGD) WITH PROPOFOL: SHX5813

## 2022-01-22 HISTORY — PX: HEMOSTASIS CLIP PLACEMENT: SHX6857

## 2022-01-22 LAB — TYPE AND SCREEN
ABO/RH(D): A POS
Antibody Screen: NEGATIVE
Unit division: 0
Unit division: 0

## 2022-01-22 LAB — BASIC METABOLIC PANEL
Anion gap: 6 (ref 5–15)
BUN: 11 mg/dL (ref 8–23)
CO2: 36 mmol/L — ABNORMAL HIGH (ref 22–32)
Calcium: 8.9 mg/dL (ref 8.9–10.3)
Chloride: 99 mmol/L (ref 98–111)
Creatinine, Ser: 0.79 mg/dL (ref 0.44–1.00)
GFR, Estimated: >60 mL/min (ref >60)
Glucose, Bld: 75 mg/dL (ref 70–99)
Potassium: 4.3 mmol/L (ref 3.5–5.1)
Sodium: 141 mmol/L (ref 135–145)

## 2022-01-22 LAB — CBC
HCT: 29.5 % — ABNORMAL LOW (ref 36.0–46.0)
Hemoglobin: 8.3 g/dL — ABNORMAL LOW (ref 12.0–15.0)
MCH: 22 pg — ABNORMAL LOW (ref 26.0–34.0)
MCHC: 28.1 g/dL — ABNORMAL LOW (ref 30.0–36.0)
MCV: 78.2 fL — ABNORMAL LOW (ref 80.0–100.0)
Platelets: 212 10*3/uL (ref 150–400)
RBC: 3.77 MIL/uL — ABNORMAL LOW (ref 3.87–5.11)
RDW: 18.4 % — ABNORMAL HIGH (ref 11.5–15.5)
WBC: 2.2 10*3/uL — ABNORMAL LOW (ref 4.0–10.5)
nRBC: 0 % (ref 0.0–0.2)

## 2022-01-22 LAB — BPAM RBC
Blood Product Expiration Date: 202306162359
Blood Product Expiration Date: 202306192359
ISSUE DATE / TIME: 202305182243
ISSUE DATE / TIME: 202305190137
Unit Type and Rh: 6200
Unit Type and Rh: 6200

## 2022-01-22 SURGERY — ESOPHAGOGASTRODUODENOSCOPY (EGD) WITH PROPOFOL
Anesthesia: General

## 2022-01-22 MED ORDER — LIDOCAINE VISCOUS HCL 2 % MT SOLN
15.0000 mL | Freq: Once | OROMUCOSAL | Status: AC
  Administered 2022-01-22: 15 mL via OROMUCOSAL

## 2022-01-22 MED ORDER — LIDOCAINE HCL (PF) 2 % IJ SOLN
INTRAMUSCULAR | Status: AC
  Filled 2022-01-22: qty 5

## 2022-01-22 MED ORDER — LORAZEPAM 2 MG/ML IJ SOLN
0.5000 mg | Freq: Four times a day (QID) | INTRAMUSCULAR | Status: DC | PRN
Start: 2022-01-22 — End: 2022-01-26

## 2022-01-22 MED ORDER — LIDOCAINE HCL (CARDIAC) PF 100 MG/5ML IV SOSY
PREFILLED_SYRINGE | INTRAVENOUS | Status: DC | PRN
  Administered 2022-01-22: 40 mg via INTRATRACHEAL

## 2022-01-22 MED ORDER — PHENYLEPHRINE 80 MCG/ML (10ML) SYRINGE FOR IV PUSH (FOR BLOOD PRESSURE SUPPORT)
PREFILLED_SYRINGE | INTRAVENOUS | Status: DC | PRN
  Administered 2022-01-22 (×4): 80 ug via INTRAVENOUS
  Administered 2022-01-22 (×2): 160 ug via INTRAVENOUS

## 2022-01-22 MED ORDER — PROPOFOL 10 MG/ML IV BOLUS
INTRAVENOUS | Status: AC
  Filled 2022-01-22: qty 20

## 2022-01-22 MED ORDER — PROPOFOL 10 MG/ML IV BOLUS
INTRAVENOUS | Status: DC | PRN
Start: 2022-01-22 — End: 2022-01-22
  Administered 2022-01-22: 50 mg via INTRAVENOUS
  Administered 2022-01-22: 10 mg via INTRAVENOUS
  Administered 2022-01-22: 20 mg via INTRAVENOUS

## 2022-01-22 MED ORDER — PROPOFOL 500 MG/50ML IV EMUL
INTRAVENOUS | Status: DC | PRN
Start: 2022-01-22 — End: 2022-01-22
  Administered 2022-01-22: 150 ug/kg/min via INTRAVENOUS

## 2022-01-22 MED ORDER — DIAZEPAM 2 MG PO TABS
2.0000 mg | ORAL_TABLET | Freq: Every day | ORAL | Status: DC
Start: 2022-01-22 — End: 2022-01-26
  Administered 2022-01-22 – 2022-01-25 (×4): 2 mg via ORAL
  Filled 2022-01-22 (×4): qty 1

## 2022-01-22 MED ORDER — LACTATED RINGERS IV SOLN
INTRAVENOUS | Status: DC | PRN
Start: 2022-01-22 — End: 2022-01-22

## 2022-01-22 MED ORDER — LIDOCAINE VISCOUS HCL 2 % MT SOLN
OROMUCOSAL | Status: AC
  Filled 2022-01-22: qty 15

## 2022-01-22 MED ORDER — PHENYLEPHRINE 80 MCG/ML (10ML) SYRINGE FOR IV PUSH (FOR BLOOD PRESSURE SUPPORT)
PREFILLED_SYRINGE | INTRAVENOUS | Status: AC
  Filled 2022-01-22: qty 10

## 2022-01-22 NOTE — Transfer of Care (Signed)
Immediate Anesthesia Transfer of Care Note  Patient: Sarah Rich  Procedure(s) Performed: ESOPHAGOGASTRODUODENOSCOPY (EGD) WITH PROPOFOL HEMOSTASIS CLIP PLACEMENT FLEXIBLE SIGMOIDOSCOPY  Patient Location: PACU  Anesthesia Type:General  Level of Consciousness: sedated, drowsy and responds to stimulation  Airway & Oxygen Therapy: Patient Spontanous Breathing and Patient connected to nasal cannula oxygen  Post-op Assessment: Report given to RN and Post -op Vital signs reviewed and stable  Post vital signs: Reviewed and stable  Last Vitals:  Vitals Value Taken Time  BP 104/53 01/22/22 0945  Temp 97.7 01/22/22 0944  Pulse 52 01/22/22 0951  Resp 18 01/22/22 0951  SpO2 100 % 01/22/22 0951  Vitals shown include unvalidated device data.  Last Pain:  Vitals:   01/22/22 0904  TempSrc:   PainSc: 8          Complications: No notable events documented.

## 2022-01-22 NOTE — Op Note (Signed)
[facility] Patient Name: Sarah Rich Procedure Date: 01/22/2022 8:10 AM MRN: [id number] Date of Birth: 1945/09/12 Attending MD: Hildred Laser , MD CSN: [id number] Age: 76 Admit Type: Inpatient Procedure:                Colonoscopy aborted secondary to poor prep. Limited                            exam due to sigmoidoscopy. Indications:              Iron deficiency anemia secondary to chronic blood                            loss Providers:                Hildred Laser, MD, Lambert Mody, Crystal Page Referring MD:             Roxan Hockey, MD Medicines:                Propofol per Anesthesia Complications:            No immediate complications. Estimated Blood Loss:     Estimated blood loss: none. Procedure:                Pre-Anesthesia Assessment:                           - Prior to the procedure, a History and Physical                            was performed, and patient medications and                            allergies were reviewed. The patient's tolerance of                            previous anesthesia was also reviewed. The risks                            and benefits of the procedure and the sedation                            options and risks were discussed with the patient.                            All questions were answered, and informed consent                            was obtained. Prior Anticoagulants: The patient has                            taken no previous anticoagulant or antiplatelet                            agents except for aspirin. ASA Grade Assessment: IV                            -  A patient with severe systemic disease that is a                            constant threat to life. After reviewing the risks                            and benefits, the patient was deemed in                            satisfactory condition to undergo the procedure.                           After obtaining informed consent, the colonoscope                             was passed under direct vision. Throughout the                            procedure, the patient's blood pressure, pulse, and                            oxygen saturations were monitored continuously. The                            [id number]-[id number] ([id number]) was introduced through the                            anus with the intention of advancing to the cecum.                            The scope was advanced to the sigmoid colon before                            the procedure was aborted. Medications were given.                            The colonoscopy was performed without difficulty.                            The patient tolerated the procedure well. The                            quality of the bowel preparation was poor. The                            rectum was photographed. Scope In: 9:31:30 AM Scope Out: 9:32:52 AM Total Procedure Duration: 0 hours 1 minute 22 seconds  Findings:      Skin tags were found on perianal exam.      The digital rectal exam was normal.      The rectum and distal sigmoid colon appeared normal but reviewed very       limited. Impression:               - Preparation of the  colon was poor.                           - Perianal skin tags found on perianal exam.                           - Limited view of rectal and sigmoid colon mucosa.                            Part of the mucosa that was seen appeared to be                            normal.                           - No specimens collected. Moderate Sedation:      Per Anesthesia Care Recommendation:           - Return patient to hospital ward for ongoing care.                           - Full liquid diet.                           - Continue present medications.                           - No aspirin, ibuprofen, naproxen, or other                            non-steroidal anti-inflammatory drugs for 7 days.                           - Consider outpatient colonoscopy in 2 months at                             the time of EGD.                           - No MRI until clips have passed. Procedure Code(s):        --- Professional ---                           [phone], 58, Colonoscopy, flexible; diagnostic,                            including collection of specimen(s) by brushing or                            washing, when performed (separate procedure) Diagnosis Code(s):        --- Professional ---                           K64.4, Residual hemorrhoidal skin tags  D50.0, Iron deficiency anemia secondary to blood                            loss (chronic) CPT copyright 2019 [employer]. All rights reserved. The codes documented in this report are preliminary and upon coder review may  be revised to meet current compliance requirements. Hildred Laser, MD Hildred Laser, MD 01/22/2022 9:58:52 AM This report has been signed electronically. Number of Addenda: 0

## 2022-01-22 NOTE — Progress Notes (Signed)
Brief EGD and colonoscopy notes.   EGD.  Normal mucosa of the esophagus and GE junction. Coffee-ground material noted in the stomach along with fresh blood trace to [place] at gastric body towards the anterior wall with active bleeding.  2 clips applied to control bleeding. 2 small hyperplastic appearing polyps in gastric body.  These were left alone. Normal bulbar and postbulbar mucosa.  Colonoscopy  Colonoscopy limited to flexible sigmoidoscopy secondary to poor prep.

## 2022-01-22 NOTE — Anesthesia Postprocedure Evaluation (Signed)
Anesthesia Post Note  Patient: Sarah Rich  Procedure(s) Performed: ESOPHAGOGASTRODUODENOSCOPY (EGD) WITH PROPOFOL HEMOSTASIS CLIP [name]  Patient location during evaluation: PACU Anesthesia Type: General Level of consciousness: awake and alert and oriented Pain management: pain level controlled Vital Signs Assessment: post-procedure vital signs reviewed and stable Respiratory status: spontaneous breathing, nonlabored ventilation, respiratory function stable and patient connected to nasal cannula oxygen Cardiovascular status: blood pressure returned to baseline and stable Postop Assessment: no apparent nausea or vomiting Anesthetic complications: no   No notable events documented.   Last Vitals:  Vitals:   01/22/22 0955 01/22/22 1000  BP: 104/65 (!) 117/59  Pulse: (!) 57 (!) 50  Resp: 18 16  Temp:    SpO2:  100%    Last Pain:  Vitals:   01/22/22 1000  TempSrc:   PainSc: 0-No pain                 Tari Lecount C Carlos Heber

## 2022-01-22 NOTE — Progress Notes (Signed)
PROGRESS NOTE     Sarah Rich, is a 76 y.o. female, DOB - [birth year], UVO:[id number]  Admit date - 01/20/2022   Admitting Physician Avika Carbine Denton Brick, MD  Outpatient Primary MD for the patient is Lemmie Evens, MD  LOS - 1  Chief Complaint  Patient presents with   Weakness       Brief Narrative: 76 y.o. 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  2 clips applied to control bleeding. 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  2 clips applied to control bleeding. -Colonoscopy on 01/22/2022 was limited by poor prep.  No acute findings -Repeat EGD and colonoscopy as outpatient in 2 months advised  2)Pulmonary Nodule--- patient with prior history of lung cancer outpatient PET scan advised to rule out underlying malignancy  3) chronic hypoxic respiratory failure--- prior history of lung cancer, COPD and ongoing tobacco use -Continue supplemental oxygen --- currently weaned down to 2 L of oxygen via nasal cannula after transfusion -Continue bronchodilators  4)HTN--stopped atenolol given significant COPD,  C/n   amlodipine instead  may use IV Hydralazine 10 mg  Every 4 hours Prn for systolic blood pressure over 160 mmhg  5)HLD--- continue pravastatin  6) social/ethics-----plan of care and advanced directives discussed with patient and son Sarah Rich, patient's grand-daughter-in-law Sarah Rich and patient's daughter-in-law Sarah Rich -Patient remains a full code without limitations to treatment  7)Dementia --- occasional sundowning confusion and behavioral issues -May use lorazepam as needed , PTA patient was on Valium, will have scheduled Valium nightly  8) generalized weakness and deconditioning--- get PT eval  Disposition/Need for in-Hospital Stay- patient unable to be discharged at this time due to --- severe symptomatic anemia requiring transfusion and endoluminal evaluation as well as IV Protonix  Status is: Inpatient   Disposition: The patient is from: Home              Anticipated d/c is to: Home              Anticipated d/c date is: 2 days              Patient currently is not medically stable to d/c. Barriers: Not Clinically Stable-   Code Status :  -  Code Status: Full Code   Family Communication:     (patient is alert, awake and coherent)  -Discussed with patient and son Sarah Rich, patient's grand-daughter-in-law Sarah Rich and patient's daughter-in-law Sarah Rich  DVT Prophylaxis  :   - SCDs  SCDs Start: 01/20/22 2318   Lab Results  Component Value Date   PLT 212 01/22/2022    Inpatient Medications  Scheduled Meds:  amLODipine  10 mg Oral Daily   feeding supplement  237 mL  Oral BID BM   folic acid  1 mg Oral Daily   mometasone-formoterol  2 puff Inhalation BID   nicotine  14 mg Transdermal Daily   [START ON 01/24/2022] pantoprazole  40 mg Intravenous Q12H   pantoprazole (PROTONIX) IV  40 mg Intravenous Once   pneumococcal 20-valent conjugate vaccine  0.5 mL Intramuscular Tomorrow-1000   pravastatin  40 mg Oral Daily   vitamin B-12  1,000 mcg Oral Daily   Continuous Infusions:   pantoprazole 8 mg/hr (01/21/22 1053)   PRN Meds:.acetaminophen **OR** acetaminophen, albuterol, hydrALAZINE, LORazepam   Anti-infectives (From admission, onward)    None      Subjective: Sarah Rich today has no fevers, no emesis,  No chest pain,   -Resting comfortably after endoscopy  Objective: Vitals:   01/22/22 1044 01/22/22 1044 01/22/22 1432 01/22/22 1445  BP:  (!) 162/82  (!) 160/70  Pulse:  63  61  Resp:  18  18  Temp:  97.7 F (36.5 C)  97.6 F (36.4 C)  TempSrc:  Oral  Oral  SpO2: 100% 100%  100%  Weight:      Height:   5\' 6"  (1.676 m)     Intake/Output Summary (Last 24 hours) at 01/22/2022 1527 Last data filed at 01/22/2022 1300 Gross per 24 hour  Intake 840 ml  Output --  Net 840 ml   Filed Weights   01/22/22 0854  Weight: 51.7 kg    Physical Exam  Gen:- Awake Alert, no conversational dyspnea HEENT:- [place].AT, No sclera icterus Nose- [place] 2 L/min Neck-Supple Neck,No JVD,.  Lungs-fair air movement, no wheezing CV- S1, S2 normal, regular  Abd-  +ve B.Sounds, Abd Soft, No tenderness,    Extremity/Skin:- No  edema, pedal pulses present  Psych-some memory and cognitive challenges at baseline, more cooperative  neuro-generalized weakness, no new focal deficits, no tremors  Data Reviewed: I have personally reviewed following labs and imaging studies  CBC: Recent Labs  Lab 01/20/22 2102 01/21/22 0703 01/21/22 1553 01/22/22 0432  WBC 2.2* 2.1* 2.9* 2.2*  NEUTROABS 1.6*  --   --   --   HGB 4.6* 7.7* 7.9* 8.3*  HCT 17.7* 26.0* 26.6* 29.5*  MCV 72.8* 76.5* 76.4* 78.2*  PLT 216 195 204 809   Basic Metabolic Panel: Recent Labs  Lab 01/20/22 2102 01/21/22 0703 01/22/22 0432  NA 138 138 141  K 3.8 3.8 4.3  CL 93* 95* 99  CO2 34* 37* 36*  GLUCOSE 108* 81 75  BUN 18 17 11   CREATININE 0.98 0.86 0.79  CALCIUM 8.6* 8.5* 8.9  MG  --  1.9  --   PHOS  --  3.9  --    GFR: Estimated Creatinine Clearance: 48.8 mL/min (by C-G formula based on SCr of  0.79 mg/dL). Liver Function Tests: Recent Labs  Lab 01/20/22 2102 01/21/22 0703  AST 18 18  ALT 10 11  ALKPHOS 52 55  BILITOT 0.3 0.8  PROT 5.8* 5.7*  ALBUMIN 3.3* 3.3*   Cardiac Enzymes: No results for input(s): CKTOTAL, CKMB, CKMBINDEX, TROPONINI in the last 168 hours. BNP (last 3 results) No results for input(s): PROBNP in the last 8760 hours. HbA1C: No results for input(s): HGBA1C in the last 72 hours. Sepsis Labs: @LABRCNTIP (procalcitonin:4,lacticidven:4) )No results found for this or any previous visit (from the past 240 hour(s)).    Radiology Studies: CT Chest Wo Contrast  Result Date: 01/20/2022 CLINICAL DATA:  Evaluate lung nodule seen on recent chest x-ray EXAM: CT CHEST  WITHOUT CONTRAST TECHNIQUE: Multidetector CT imaging of the chest was performed following the standard protocol without IV contrast. RADIATION DOSE REDUCTION: This exam was performed according to the departmental dose-optimization program which includes automated exposure control, adjustment of the mA and/or kV according to patient size and/or use of iterative reconstruction technique. COMPARISON:  Chest x-ray from earlier in the same day as well as CT from 09/02/2021 FINDINGS: Cardiovascular: Somewhat limited due to lack of IV contrast. Atherosclerotic calcifications are noted of the thoracic aorta. No aneurysmal dilatation is seen. Heavy coronary calcifications are noted. No cardiac enlargement is seen. Cardiac blood pool is decreased in attenuation which may be related to underlying anemia. Mediastinum/Nodes: Thoracic inlet is within normal limits. No hilar or mediastinal adenopathy is noted. The esophagus is within normal limits. Lungs/Pleura: Lungs are well aerated bilaterally. The previously seen nodule in the left lower lobe is again seen and stable in appearance. Some persistent traction upon the fissure is seen but stable. 17 mm nodule is noted along the posterior aspect of the pericardium in the medial  aspect of the left lobe of the liver best seen on image number 125 of series 4. This is stable in appearance from the prior exam. Ground-glass nodule is noted in the posterior left lower lobe best seen on image number 106 of series 4 measuring up to 14 mm. This is slightly larger than that seen on the prior exam and may be related to technical factors. The ground-glass nodule seen previously in the right middle lobe is again identified measuring up to 18 mm. The solid component however appears more prominent now measuring up to 6 mm. No other significant nodular changes in the right lung are noted. Diffuse emphysematous changes are noted throughout both lungs. No effusion is noted. Upper Abdomen: Visualized upper abdomen shows no acute abnormality. Musculoskeletal: No rib abnormality is noted. Degenerative changes of the thoracic spine are seen. No acute abnormality is noted. IMPRESSION: Slight increase in the solid component in a ground-glass nodule in the right middle lobe. A portion of this corresponds to the changes on recent chest x-ray although the majority of the density is felt to be related to the overlying nipple shadow given its location. Given the increase in solid component however, PET-CT is recommended for further evaluation. Stable nodules in the left lower lobe, one superiorly with pleural retraction and a second along the posterior aspect of the pericardium. These can also be followed on subsequent exams. Ground-glass nodule within the left lower lobe slightly more prominent than that seen on the prior exam. This could also be evaluated on follow-up imaging. Aortic Atherosclerosis (ICD10-I70.0) and Emphysema (ICD10-J43.9). Electronically Signed   By: Inez Catalina M.D.   On: 01/20/2022 22:54   DG Chest Port 1 View  Result Date: 01/20/2022 CLINICAL DATA:  Weakness and shortness of breath for 1 day EXAM: PORTABLE CHEST 1 VIEW COMPARISON:  09/02/2021 FINDINGS: Cardiac shadow is within normal  limits. Aortic calcifications are noted. The lungs are hyperinflated. Nodular density is noted in the right lung base measuring approximately 16 mm. This lies in a similar area to a previously seen right middle lobe ground-glass nodule. Left perihilar nodule is noted in the lower lobe similar to that seen on prior CT. No other focal infiltrate or nodular changes are seen. IMPRESSION: Solid appearing nodule over the right lung base in an area of previous ground-glass nodularity. Right nipple shadow projects in this region as well and may contribute to this nodular density. Noncontrast  CT would be helpful for further clarification. Stable left lower lobe nodule unchanged from the prior CT. Electronically Signed   By: Inez Catalina M.D.   On: 01/20/2022 21:37     Scheduled Meds:  amLODipine  10 mg Oral Daily   feeding supplement  237 mL Oral BID BM   folic acid  1 mg Oral Daily   mometasone-formoterol  2 puff Inhalation BID   nicotine  14 mg Transdermal Daily   [START ON 01/24/2022] pantoprazole  40 mg Intravenous Q12H   pantoprazole (PROTONIX) IV  40 mg Intravenous Once   pneumococcal 20-valent conjugate vaccine  0.5 mL Intramuscular Tomorrow-1000   pravastatin  40 mg Oral Daily   vitamin B-12  1,000 mcg Oral Daily   Continuous Infusions:  pantoprazole 8 mg/hr (01/21/22 1053)     LOS: 1 day   Roxan Hockey M.D on 01/22/2022 at 3:27 PM  Go to [email or web address] - for contact info  [facility] - Office  [phone]  If 7PM-7AM, please contact night-coverage [email or web address] Password [facility] 01/22/2022, 3:27 PM

## 2022-01-22 NOTE — Progress Notes (Signed)
Subjective:  Patient has no complaints.  She has been passing liquid brown stools.  She only drank few cups of GoLytely.  I was not called by the nursing staff.  Current Medications: Current Facility-Administered Medications:    0.9 %  sodium chloride infusion, , Intravenous, Continuous, Mahon, Courtney L, NP, Last Rate: 20 mL/hr at 01/21/22 1353, New Bag at 01/21/22 1353   [MAR Hold] acetaminophen (TYLENOL) tablet 650 mg, 650 mg, Oral, Q6H PRN **OR** [MAR Hold] acetaminophen (TYLENOL) suppository 650 mg, 650 mg, Rectal, Q6H PRN, Adefeso, Oladapo, DO   [MAR Hold] albuterol (VENTOLIN HFA) 108 (90 Base) MCG/ACT inhaler 2 puff, 2 puff, Inhalation, Q6H PRN, Adefeso, Oladapo, DO   [MAR Hold] amLODipine (NORVASC) tablet 10 mg, 10 mg, Oral, Daily, Emokpae, Courage, MD, 10 mg at 01/21/22 1449   [MAR Hold] feeding supplement (ENSURE ENLIVE / ENSURE PLUS) liquid 237 mL, 237 mL, Oral, BID BM, Adefeso, Oladapo, DO   [MAR Hold] folic acid (FOLVITE) tablet 1 mg, 1 mg, Oral, Daily, Adefeso, Oladapo, DO, 1 mg at 01/21/22 0822   [facility] Hold] hydrALAZINE (APRESOLINE) injection 10 mg, 10 mg, Intravenous, Q6H PRN, Denton Brick, Courage, MD   [MAR Hold] LORazepam (ATIVAN) injection 1 mg, 1 mg, Intravenous, Q6H PRN, Emokpae, Courage, MD, 1 mg at 01/21/22 2309   [MAR Hold] mometasone-formoterol (DULERA) 200-5 MCG/ACT inhaler 2 puff, 2 puff, Inhalation, BID, Emokpae, Courage, MD, 2 puff at 01/21/22 2022   [MAR Hold] nicotine (NICODERM CQ - dosed in mg/24 hours) patch 14 mg, 14 mg, Transdermal, Daily, Emokpae, Courage, MD, 14 mg at 01/21/22 1449   [MAR Hold] pantoprazole (PROTONIX) injection 40 mg, 40 mg, Intravenous, Q12H, Adefeso, Oladapo, DO   [MAR Hold] pantoprazole (PROTONIX) injection 40 mg, 40 mg, Intravenous, Once, Adefeso, Oladapo, DO   pantoprozole (PROTONIX) 80 mg /NS 100 mL infusion, 8 mg/hr, Intravenous, Continuous, Adefeso, Oladapo, DO, Last Rate: 10 mL/hr at 01/21/22 1053, 8 mg/hr at 01/21/22 1053   [MAR Hold]  pneumococcal 20-valent conjugate vaccine (PREVNAR 20) injection 0.5 mL, 0.5 mL, Intramuscular, Tomorrow-1000, Emokpae, Courage, MD   [MAR Hold] pravastatin (PRAVACHOL) tablet 40 mg, 40 mg, Oral, Daily, Adefeso, Oladapo, DO, 40 mg at 01/21/22 0822   [MAR Hold] vitamin B-12 (CYANOCOBALAMIN) tablet 1,000 mcg, 1,000 mcg, Oral, Daily, Adefeso, Oladapo, DO, 1,000 mcg at 01/21/22 2334   Objective: Blood pressure (!) 148/75, pulse (!) 48, temperature (!) 97.5 F (36.4 C), temperature source Oral, resp. rate 20, weight 51.7 kg, SpO2 98 %. Patient is alert and responds appropriate to questions. She appears pale. Cardiac exam with regular rhythm normal S1 and S2.  No murmur or gallop noted. Auscultation lungs reveal diminished breath sounds bilaterally.  No rales or rhonchi noted. Abdomen is flat soft and nontender with organomegaly or masses. Trace to 1+ pitting edema around ankles.  Labs/studies Results:      Latest Ref Rng & Units 01/22/2022    4:32 AM 01/21/2022    3:53 PM 01/21/2022    7:03 AM  CBC  WBC 4.0 - 10.5 K/uL 2.2   2.9   2.1    Hemoglobin 12.0 - 15.0 g/dL 8.3   7.9   7.7    Hematocrit 36.0 - 46.0 % 29.5   26.6   26.0    Platelets 150 - 400 K/uL 212   204   195         Latest Ref Rng & Units 01/22/2022    4:32 AM 01/21/2022    7:03 AM 01/20/2022  9:02 PM  CMP  Glucose 70 - 99 mg/dL 75   81   108    BUN 8 - 23 mg/dL '11   17   18    ' Creatinine 0.44 - 1.00 mg/dL 0.79   0.86   0.98    Sodium 135 - 145 mmol/L 141   138   138    Potassium 3.5 - 5.1 mmol/L 4.3   3.8   3.8    Chloride 98 - 111 mmol/L 99   95   93    CO2 22 - 32 mmol/L 36   37   34    Calcium 8.9 - 10.3 mg/dL 8.9   8.5   8.6    Total Protein 6.5 - 8.1 g/dL  5.7   5.8    Total Bilirubin 0.3 - 1.2 mg/dL  0.8   0.3    Alkaline Phos 38 - 126 U/L  55   52    AST 15 - 41 U/L  18   18    ALT 0 - 44 U/L  11   10         Latest Ref Rng & Units 01/21/2022    7:03 AM 01/20/2022    9:02 PM 09/17/2020   10:31 AM  Hepatic  Function  Total Protein 6.5 - 8.1 g/dL 5.7   5.8   6.3    Albumin 3.5 - 5.0 g/dL 3.3   3.3   3.5    AST 15 - 41 U/L '18   18   27    ' ALT 0 - 44 U/L '11   10   16    ' Alk Phosphatase 38 - 126 U/L 55   52   60    Total Bilirubin 0.3 - 1.2 mg/dL 0.8   0.3   0.3    Bilirubin, Direct 0.0 - 0.2 mg/dL  0.1         Assessment:  #1.  Iron deficiency anemia.  Stool was guaiac positive.  She has received 2 units of PRBCs.  Hemoglobin is up to 8.3 g.  Patient takes low-dose aspirin.  She does have a history of colonic adenoma with last colonoscopy in 2013.  #2.  Severe COPD with O2 dependence.  Patient appears to be stable from respiratory standpoint.  #3.  Chronic leukopenia.  White cell count is down to 2.1.  No history of cirrhosis.   Plan:  Proceed with esophagogastroduodenoscopy followed by colonoscopy under monitored anesthesia care.

## 2022-01-22 NOTE — Op Note (Addendum)
[facility] Patient Name: Sarah Rich Procedure Date: 01/22/2022 8:09 AM MRN: [id number] Date of Birth: [birth year] Attending MD: Hildred Laser , MD CSN: [id number] Age: 76 Admit Type: Inpatient Procedure:                Upper GI endoscopy Indications:              Iron deficiency anemia secondary to chronic blood                            loss Providers:                Hildred Laser, MD, Lambert Mody, Crystal Page Referring MD:             Roxan Hockey, MD Medicines:                Propofol per Anesthesia Complications:            No immediate complications. Estimated Blood Loss:     Estimated blood loss: none.                           . Procedure:                Pre-Anesthesia Assessment:                           - Prior to the procedure, a History and Physical                            was performed, and patient medications and                            allergies were reviewed. The patient's tolerance of                            previous anesthesia was also reviewed. The risks                            and benefits of the procedure and the sedation                            options and risks were discussed with the patient.                            All questions were answered, and informed consent                            was obtained. Prior Anticoagulants: The patient has                            taken no previous anticoagulant or antiplatelet                            agents except for aspirin. ASA Grade Assessment: IV                            - A  patient with severe systemic disease that is a                            constant threat to life. After reviewing the risks                            and benefits, the patient was deemed in                            satisfactory condition to undergo the procedure.                           After obtaining informed consent, the endoscope was                            passed under direct vision.  Throughout the                            procedure, the patient's blood pressure, pulse, and                            oxygen saturations were monitored continuously. The                            [id number]-H190 ([id number]) scope was introduced through the                            mouth, and advanced to the second part of duodenum.                            The upper GI endoscopy was accomplished without                            difficulty. The patient tolerated the procedure                            well. Scope In: 9:13:38 AM Scope Out: 9:22:48 AM Total Procedure Duration: 0 hours 9 minutes 10 seconds  Findings:      The hypopharynx was normal.      The examined esophagus was normal.      The Z-line was regular and was found 41 cm from the incisors.      Hematin (altered blood/coffee-ground-like material) was found in the       gastric body.      A Dieulafoy lesion with oozing bleeding and stigmata of recent bleeding       was found on the anterior wall of the gastric body. To stop active       bleeding, two hemostatic clips were successfully placed (MR       conditional). There was no bleeding at the end of the procedure.      Two diminutive polyps at gastric body. these were left alone.      The exam of the stomach was otherwise normal.      The duodenal bulb and second portion of the duodenum were normal. Impression:               -  Normal hypopharynx.                           - Normal esophagus.                           - Z-line regular, 41 cm from the incisors.                           - Hematin (altered blood/coffee-ground-like                            material) in the gastric body.                           - Dieulafoy lesion of stomach.                           -2 diminutive gastric polyps. These were left alone.                           - Normal duodenal bulb and second portion of the                            duodenum.                           - No specimens  collected. Moderate Sedation:      Per Anesthesia Care Recommendation:           - Repeat upper endoscopy in 2 months.                           - See the other procedure note for documentation of                            additional recommendations. Procedure Code(s):        --- Professional ---                           [phone], Esophagogastroduodenoscopy, flexible,                            transoral; with control of bleeding, any method Diagnosis Code(s):        --- Professional ---                           K92.2, Gastrointestinal hemorrhage, unspecified                           K31.82, Dieulafoy lesion (hemorrhagic) of stomach                            and duodenum                           D50.0, Iron deficiency anemia secondary to blood  loss (chronic) CPT copyright 2019 [employer]. All rights reserved. The codes documented in this report are preliminary and upon coder review may  be revised to meet current compliance requirements. Hildred Laser, MD Hildred Laser, MD 01/22/2022 9:52:39 AM This report has been signed electronically. Number of Addenda: 0

## 2022-01-23 DIAGNOSIS — D500 Iron deficiency anemia secondary to blood loss (chronic): Secondary | ICD-10-CM | POA: Diagnosis not present

## 2022-01-23 DIAGNOSIS — K644 Residual hemorrhoidal skin tags: Secondary | ICD-10-CM | POA: Diagnosis not present

## 2022-01-23 DIAGNOSIS — K922 Gastrointestinal hemorrhage, unspecified: Secondary | ICD-10-CM | POA: Diagnosis not present

## 2022-01-23 DIAGNOSIS — K3182 Dieulafoy lesion (hemorrhagic) of stomach and duodenum: Secondary | ICD-10-CM | POA: Diagnosis not present

## 2022-01-23 DIAGNOSIS — D649 Anemia, unspecified: Secondary | ICD-10-CM | POA: Diagnosis not present

## 2022-01-23 LAB — CBC
HCT: 26.8 % — ABNORMAL LOW (ref 36.0–46.0)
Hemoglobin: 7.7 g/dL — ABNORMAL LOW (ref 12.0–15.0)
MCH: 22.4 pg — ABNORMAL LOW (ref 26.0–34.0)
MCHC: 28.7 g/dL — ABNORMAL LOW (ref 30.0–36.0)
MCV: 77.9 fL — ABNORMAL LOW (ref 80.0–100.0)
Platelets: 221 10*3/uL (ref 150–400)
RBC: 3.44 MIL/uL — ABNORMAL LOW (ref 3.87–5.11)
RDW: 19.3 % — ABNORMAL HIGH (ref 11.5–15.5)
WBC: 2.5 10*3/uL — ABNORMAL LOW (ref 4.0–10.5)
nRBC: 0 % (ref 0.0–0.2)

## 2022-01-23 NOTE — Progress Notes (Signed)
Subjective:  Patient says she has been passing light brown-colored stools.  No melena or rectal bleeding reported.  Patient says she does not have a good appetite.  She states she lives alone and some days she does not eat at all.  She says her son brought her a sandwich this morning and she has eaten half of it.  She feels weak.  She says PT wanted her to stand but she wanted to wait and do it later in the day.  Current Medications:  Current Facility-Administered Medications:    acetaminophen (TYLENOL) tablet 650 mg, 650 mg, Oral, Q6H PRN **OR** acetaminophen (TYLENOL) suppository 650 mg, 650 mg, Rectal, Q6H PRN, Adefeso, Oladapo, DO   albuterol (VENTOLIN HFA) 108 (90 Base) MCG/ACT inhaler 2 puff, 2 puff, Inhalation, Q6H PRN, Adefeso, Oladapo, DO   amLODipine (NORVASC) tablet 10 mg, 10 mg, Oral, Daily, Emokpae, Courage, MD, 10 mg at 01/23/22 0815   diazepam (VALIUM) tablet 2 mg, 2 mg, Oral, QHS, Emokpae, Courage, MD, 2 mg at 01/22/22 2241   feeding supplement (ENSURE ENLIVE / ENSURE PLUS) liquid 237 mL, 237 mL, Oral, BID BM, Adefeso, Oladapo, DO, 237 mL at 01/60/10 9323   folic acid (FOLVITE) tablet 1 mg, 1 mg, Oral, Daily, Adefeso, Oladapo, DO, 1 mg at 01/23/22 0815   hydrALAZINE (APRESOLINE) injection 10 mg, 10 mg, Intravenous, Q6H PRN, Emokpae, Courage, MD   LORazepam (ATIVAN) injection 0.5 mg, 0.5 mg, Intravenous, Q6H PRN, Emokpae, Courage, MD   mometasone-formoterol (DULERA) 200-5 MCG/ACT inhaler 2 puff, 2 puff, Inhalation, BID, Emokpae, Courage, MD, 2 puff at 01/23/22 0723   nicotine (NICODERM CQ - dosed in mg/24 hours) patch 14 mg, 14 mg, Transdermal, Daily, Emokpae, Courage, MD, 14 mg at 01/23/22 0831   [START ON 01/24/2022] pantoprazole (PROTONIX) injection 40 mg, 40 mg, Intravenous, Q12H, Adefeso, Oladapo, DO   pantoprazole (PROTONIX) injection 40 mg, 40 mg, Intravenous, Once, Adefeso, Oladapo, DO   pantoprozole (PROTONIX) 80 mg /NS 100 mL infusion, 8 mg/hr, Intravenous, Continuous,  Adefeso, Oladapo, DO, Last Rate: 10 mL/hr at 01/23/22 0819, 8 mg/hr at 01/23/22 0819   pneumococcal 20-valent conjugate vaccine (PREVNAR 20) injection 0.5 mL, 0.5 mL, Intramuscular, Tomorrow-1000, Emokpae, Courage, MD   pravastatin (PRAVACHOL) tablet 40 mg, 40 mg, Oral, Daily, Adefeso, Oladapo, DO, 40 mg at 01/23/22 0815   vitamin B-12 (CYANOCOBALAMIN) tablet 1,000 mcg, 1,000 mcg, Oral, Daily, Adefeso, Oladapo, DO, 1,000 mcg at 01/23/22 0815  Objective: Blood pressure 110/62, pulse 64, temperature 98.1 F (36.7 C), temperature source Oral, resp. rate 18, height _0  (1.676 m), weight 51.7 kg, SpO2 100 %. Patient is alert and in no acute distress. Abdominal examination within normal limits.  Labs/studies Results:      Latest Ref Rng & Units 01/23/2022    5:49 AM 01/22/2022    4:32 AM 01/21/2022    3:53 PM  CBC  WBC 4.0 - 10.5 K/uL 2.5   2.2   2.9    Hemoglobin 12.0 - 15.0 g/dL 7.7   8.3   7.9    Hematocrit 36.0 - 46.0 % 26.8   29.5   26.6    Platelets 150 - 400 K/uL 221   212   204         Latest Ref Rng & Units 01/22/2022    4:32 AM 01/21/2022    7:03 AM 01/20/2022    9:02 PM  CMP  Glucose 70 - 99 mg/dL 75   81   108    BUN 8 -  23 mg/dL _0 Creatinine 0.44 - 1.00 mg/dL 0.79   0.86   0.98    Sodium 135 - 145 mmol/L 141   138   138    Potassium 3.5 - 5.1 mmol/L 4.3   3.8   3.8    Chloride 98 - 111 mmol/L 99   95   93    CO2 22 - 32 mmol/L 36   37   34    Calcium 8.9 - 10.3 mg/dL 8.9   8.5   8.6    Total Protein 6.5 - 8.1 g/dL  5.7   5.8    Total Bilirubin 0.3 - 1.2 mg/dL  0.8   0.3    Alkaline Phos 38 - 126 U/L  55   52    AST 15 - 41 U/L  18   18    ALT 0 - 44 U/L  11   10         Latest Ref Rng & Units 01/21/2022    7:03 AM 01/20/2022    9:02 PM 09/17/2020   10:31 AM  Hepatic Function  Total Protein 6.5 - 8.1 g/dL 5.7   5.8   6.3    Albumin 3.5 - 5.0 g/dL 3.3   3.3   3.5    AST 15 - 41 U/L _1 ALT 0 - 44 U/L _2 Alk Phosphatase 38 -  126 U/L 55   52   60    Total Bilirubin 0.3 - 1.2 mg/dL 0.8   0.3   0.3    Bilirubin, Direct 0.0 - 0.2 mg/dL  0.1         Assessment:  #1.  Iron deficiency anemia secondary to chronic GI bleed.  While there is no history of melena or rectal bleeding her stool was guaiac positive.  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  Colonoscopy could not be completed because of poor prep. Patient has received 2 units of PRBCs.  #2.  Severe COPD with O2 dependence.  Patient appears to be at baseline.  #3.  Chronic leukopenia which has been worked up in the past.  Leukopenia not secondary to cirrhosis.  WBC is 2.5 today.  #4.  Malnutrition.  Low BMI of 18.40.  She has advanced COPD and she also has history of lung carcinoma.  She is having to work harder to breathe but calorie intake is diminished.   Recommendations  Advanced to heart healthy diet. CBC in AM. Continue IV PPI for another 24 hours. EGD and colonoscopy in 2 months or so.

## 2022-01-23 NOTE — Plan of Care (Signed)
  Problem: Acute Rehab PT Goals(only PT should resolve) Goal: Pt Will Go Supine/Side To Sit Outcome: Progressing Flowsheets (Taken 01/23/2022 1140) Pt will go Supine/Side to Sit: with supervision Goal: Pt Will Go Sit To Supine/Side Outcome: Progressing Flowsheets (Taken 01/23/2022 1140) Pt will go Sit to Supine/Side: with supervision Goal: Patient Will Transfer Sit To/From Stand Outcome: Progressing Flowsheets (Taken 01/23/2022 1140) Patient will transfer sit to/from stand:  with minimal assist  with min guard assist Goal: Pt Will Transfer Bed To Chair/Chair To Bed Outcome: Progressing Flowsheets (Taken 01/23/2022 1140) Pt will Transfer Bed to Chair/Chair to Bed:  min guard assist  with min assist Goal: Pt Will Ambulate Outcome: Progressing Flowsheets (Taken 01/23/2022 1140) Pt will Ambulate:  25 feet  with least restrictive assistive device  with min guard assist Goal: Pt/caregiver will Perform Home Exercise Program Outcome: Progressing Flowsheets (Taken 01/23/2022 1140) Pt/caregiver will Perform Home Exercise Program:  For increased strengthening  For improved balance  Independently  11:41 AM, 01/23/22 Mearl Latin PT, DPT Physical Therapist at [facility]

## 2022-01-23 NOTE — Evaluation (Signed)
Physical Therapy Evaluation Patient Details Name: Sarah Rich MRN: [id number] DOB: 1945-11-22 Today's Date: 01/23/2022  History of Present Illness  Sarah Rich is a 76 y.o. female with medical history significant of COPD, hyperlipidemia, hypertension, chronic respiratory failure with hypoxia on supplemental oxygen via [place] at 5-6 L/min, lung cancer who presents to the emergency department due to 6 weeks of progressing weakness which rapidly worsened today with shakiness and worsening weakness.  She complained of bilateral leg swelling and she states that she was using Lasix every 3 days as advised by her PCP, but has recently stopped its use.  She denies chest pain, fever, chills, headache, nausea, vomiting.   Clinical Impression  Patient limited for functional mobility as stated below secondary to BLE weakness, fatigue and poor standing balance. Patient requires assist to pull to seated EOB. She demonstrates good sitting balance and fair sitting tolerance with noted fatigue with sitting. She is able to transfer to standing x2 with RW and assist for LE weakness. She demonstrates impaired standing balance requiring use of RW and poor standing tolerance. She is limited to a few lateral steps at bedside due to fatigue and returns to bed at end of session. Patient will benefit from continued physical therapy in hospital and recommended venue below to increase strength, balance, endurance for safe ADLs and gait.        Recommendations for follow up therapy are one component of a multi-disciplinary discharge planning process, led by the attending physician.  Recommendations may be updated based on patient status, additional functional criteria and insurance authorization.  Follow Up Recommendations Skilled nursing-short term rehab (<3 hours/day)    Assistance Recommended at Discharge Intermittent Supervision/Assistance  Patient can return home with the following  A lot of help with walking and/or  transfers;A lot of help with bathing/dressing/bathroom;Assistance with cooking/housework;Assist for transportation;Help with stairs or ramp for entrance    Equipment Recommendations None recommended by PT  Recommendations for Other Services       Functional Status Assessment Patient has had a recent decline in their functional status and demonstrates the ability to make significant improvements in function in a reasonable and predictable amount of time.     Precautions / Restrictions Precautions Precautions: Fall Restrictions Weight Bearing Restrictions: No      Mobility  Bed Mobility Overal bed mobility: Needs Assistance Bed Mobility: Supine to Sit, Sit to Supine     Supine to sit: Min assist, HOB elevated Sit to supine: Min guard   General bed mobility comments: slow, labored    Transfers Overall transfer level: Needs assistance Equipment used: Rolling walker (2 wheels) Transfers: Sit to/from Stand Sit to Stand: Min assist, Mod assist           General transfer comment: labored, cueing for proper RW use and sequcencing; transfer x 2 to standing    Ambulation/Gait Ambulation/Gait assistance: Min assist Gait Distance (Feet): 2 Feet Assistive device: Rolling walker (2 wheels) Gait Pattern/deviations: Trunk flexed Gait velocity: decreased     General Gait Details: small lateral steps at bedside with RW  Stairs            Wheelchair Mobility    Modified Rankin (Stroke Patients Only)       Balance Overall balance assessment: Needs assistance Sitting-balance support: No upper extremity supported, Feet supported Sitting balance-Leahy Scale: Good Sitting balance - Comments: good/fair seated EOB   Standing balance support: Bilateral upper extremity supported, Reliant on assistive device for balance Standing balance-Leahy Scale: Poor  Standing balance comment: fair/poor                             Pertinent Vitals/Pain Pain  Assessment Pain Assessment: No/denies pain    Home Living Family/patient expects to be discharged to:: Private residence Living Arrangements: Alone Available Help at Discharge: Family;Available PRN/intermittently Type of Home: House Home Access: Stairs to enter Entrance Stairs-Rails: Right Entrance Stairs-Number of Steps: 5   Home Layout: One level Home Equipment: Toilet riser;Cane - single point;Shower seat;BSC/3in1;Grab bars - tub/shower;Grab bars - toilet      Prior Function Prior Level of Function : Independent/Modified Independent             Mobility Comments: household ambulation without AD ADLs Comments: independent with basic ADL, family/neighbor assist PRN     Hand Dominance        Extremity/Trunk Assessment   Upper Extremity Assessment Upper Extremity Assessment: Generalized weakness    Lower Extremity Assessment Lower Extremity Assessment: Generalized weakness    Cervical / Trunk Assessment Cervical / Trunk Assessment: Kyphotic  Communication   Communication: No difficulties  Cognition Arousal/Alertness: Awake/alert Behavior During Therapy: WFL for tasks assessed/performed Overall Cognitive Status: Within Functional Limits for tasks assessed                                          General Comments      Exercises     Assessment/Plan    PT Assessment Patient needs continued PT services  PT Problem List Decreased strength;Decreased mobility;Decreased activity tolerance;Decreased balance;Decreased knowledge of use of DME;Cardiopulmonary status limiting activity       PT Treatment Interventions DME instruction;Therapeutic exercise;Gait training;Balance training;Stair training;Neuromuscular re-education;Functional mobility training;Therapeutic activities;Patient/family education;Manual techniques    PT Goals (Current goals can be found in the Care Plan section)  Acute Rehab PT Goals Patient Stated Goal: return home to  dogs PT Goal Formulation: With patient Time For Goal Achievement: 02/06/22 Potential to Achieve Goals: Good    Frequency Min 3X/week     Co-evaluation               AM-PAC PT "6 Clicks" Mobility  Outcome Measure Help needed turning from your back to your side while in a flat bed without using bedrails?: None Help needed moving from lying on your back to sitting on the side of a flat bed without using bedrails?: A Little Help needed moving to and from a bed to a chair (including a wheelchair)?: A Lot Help needed standing up from a chair using your arms (e.g., wheelchair or bedside chair)?: A Lot Help needed to walk in hospital room?: A Lot Help needed climbing 3-5 steps with a railing? : A Lot 6 Click Score: 15    End of Session Equipment Utilized During Treatment: Gait belt;Oxygen Activity Tolerance: Patient limited by fatigue Patient left: in bed;with family/visitor present;with call bell/phone within reach Nurse Communication: Mobility status PT Visit Diagnosis: Unsteadiness on feet (R26.81);Other abnormalities of gait and mobility (R26.89);Muscle weakness (generalized) (M62.81)    Time: 0973-5329 PT Time Calculation (min) (ACUTE ONLY): 28 min   Charges:   PT Evaluation $PT Eval Low Complexity: 1 Low PT Treatments $Therapeutic Activity: 23-37 mins        11:39 AM, 01/23/22 Mearl Latin PT, DPT Physical Therapist at [facility]

## 2022-01-23 NOTE — Progress Notes (Signed)
PROGRESS NOTE     Sarah Rich, is a 76 y.o. female, DOB - 12/23/1945, IHK:[id number]  Admit date - 01/20/2022   Admitting Physician Filomena Pokorney Denton Brick, MD  Outpatient Primary MD for the patient is Lemmie Evens, MD  LOS - 2  Chief Complaint  Patient presents with   Weakness       Brief Narrative: 76 y.o. female with medical history significant of COPD, hyperlipidemia, hypertension, chronic respiratory failure with hypoxia on supplemental oxygen via [place] at 5-6 L/min, lung cancer admitted on 01/20/2022 with acute symptomatic anemia with hemoglobin down to 4.6 from a baseline usually between 13 and 14 -EGD on 01/22/2022 shows Coffee-ground material noted in the stomach along with fresh blood trace to Diulafoy at gastric body towards the anterior wall with active bleeding.  2 clips applied to control bleeding. 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  2 clips applied to control bleeding. -Colonoscopy on 01/22/2022 was limited by poor prep.  No acute findings -Repeat EGD and colonoscopy as outpatient in 2 months advised  2)Pulmonary Nodule--- patient with prior history of lung cancer outpatient PET scan advised to rule out underlying malignancy  3) chronic hypoxic respiratory failure--- prior history of lung cancer, COPD and ongoing tobacco use -Continue supplemental oxygen --- currently weaned down to 2 L of oxygen via nasal cannula after transfusion -Continue bronchodilators  4)HTN--stopped atenolol given significant COPD,  C/n  amlodipine instead  may use IV Hydralazine 10 mg  Every 4  hours Prn for systolic blood pressure over 160 mmhg  5)HLD--- continue pravastatin  6) social/ethics-----plan of care and advanced directives discussed with patient and son Simona Huh, patient's grand-daughter-in-law Nira Conn and patient's daughter-in-law Joaquim Lai -Patient remains a full code without limitations to treatment  7)Dementia --- occasional sundowning confusion and behavioral issues -May use lorazepam as needed , PTA patient was on Valium, will have scheduled Valium nightly  8)Generalized weakness and deconditioning--- PT eval appreciated recommends SNF rehab  Disposition/Need for in-Hospital Stay- patient unable to be discharged at this time due to --- severe symptomatic anemia requiring transfusion and endoluminal evaluation as well as IV Protonix  Status is: Inpatient   Disposition: The patient is from: Home              Anticipated d/c is to: SNF              Anticipated d/c date is: 1 day              Patient currently is not medically stable to d/c. Barriers: Not Clinically Stable-   Code Status :  -  Code Status: Full Code   Family Communication:     (patient is alert, awake and coherent)  -Discussed with patient and son Simona Huh, patient's grand-daughter-in-law Nira Conn and patient's daughter-in-law Joaquim Lai  DVT Prophylaxis  :   - SCDs  SCDs Start: 01/20/22 2318   Lab Results  Component Value Date   PLT 221 01/23/2022    Inpatient Medications  Scheduled Meds:  amLODipine  10 mg Oral Daily   diazepam  2 mg Oral QHS   feeding supplement  237 mL Oral BID  BM   folic acid  1 mg Oral Daily   mometasone-formoterol  2 puff Inhalation BID   nicotine  14 mg Transdermal Daily   [START ON 01/24/2022] pantoprazole  40 mg Intravenous Q12H   pantoprazole (PROTONIX) IV  40 mg Intravenous Once   pneumococcal 20-valent conjugate vaccine  0.5 mL Intramuscular Tomorrow-1000   pravastatin  40 mg Oral Daily   vitamin B-12  1,000 mcg Oral Daily   Continuous Infusions:   pantoprazole 8 mg/hr (01/23/22 0819)   PRN Meds:.acetaminophen **OR** acetaminophen, albuterol, hydrALAZINE, LORazepam   Anti-infectives (From admission, onward)    None      Subjective: Sarah Rich today has no fevers, no emesis,  No chest pain,   -Had brown stool -Family at bedside -Oral intake is not great  Objective: Vitals:   01/22/22 2049 01/23/22 0516 01/23/22 0724 01/23/22 1300  BP: 122/62 110/62  104/62  Pulse: 70 64  76  Resp: 20 18  18   Temp: 97.9 F (36.6 C) 98.1 F (36.7 C)  98 F (36.7 C)  TempSrc: Oral Oral  Oral  SpO2: 100% 100% 100% 100%  Weight:      Height:        Intake/Output Summary (Last 24 hours) at 01/23/2022 1625 Last data filed at 01/23/2022 1457 Gross per 24 hour  Intake 713 ml  Output 4 ml  Net 709 ml   Filed Weights   01/22/22 0854  Weight: 51.7 kg    Physical Exam  Gen:- Awake Alert, no conversational dyspnea HEENT:- [place].AT, No sclera icterus Nose- [place] 2 L/min Neck-Supple Neck,No JVD,.  Lungs-fair air movement, no wheezing CV- S1, S2 normal, regular  Abd-  +ve B.Sounds, Abd Soft, No tenderness,    Extremity/Skin:- No  edema, pedal pulses present  Psych-some memory and cognitive challenges at baseline, more cooperative  neuro-generalized weakness, no new focal deficits, no tremors  Data Reviewed: I have personally reviewed following labs and imaging studies  CBC: Recent Labs  Lab 01/20/22 2102 01/21/22 0703 01/21/22 1553 01/22/22 0432 01/23/22 0549  WBC 2.2* 2.1* 2.9* 2.2* 2.5*  NEUTROABS 1.6*  --   --   --   --   HGB 4.6* 7.7* 7.9* 8.3* 7.7*  HCT 17.7* 26.0* 26.6* 29.5* 26.8*  MCV 72.8* 76.5* 76.4* 78.2* 77.9*  PLT 216 195 204 212 295   Basic Metabolic Panel: Recent Labs  Lab 01/20/22 2102 01/21/22 0703 01/22/22 0432  NA 138 138 141  K 3.8 3.8 4.3  CL 93* 95* 99  CO2 34* 37* 36*  GLUCOSE 108* 81 75  BUN 18 17 11   CREATININE 0.98 0.86 0.79  CALCIUM 8.6* 8.5* 8.9  MG  --  1.9  --   PHOS  --  3.9  --     GFR: Estimated Creatinine Clearance: 48.8 mL/min (by C-G formula based on SCr of 0.79 mg/dL). Liver Function Tests: Recent Labs  Lab 01/20/22 2102 01/21/22 0703  AST 18 18  ALT 10 11  ALKPHOS 52 55  BILITOT 0.3 0.8  PROT 5.8* 5.7*  ALBUMIN 3.3* 3.3*   Cardiac Enzymes:  Radiology Studies: No results found.   Scheduled Meds:  amLODipine  10 mg Oral Daily   diazepam  2 mg Oral QHS   feeding supplement  237 mL Oral BID BM   folic acid  1 mg Oral Daily   mometasone-formoterol  2 puff Inhalation BID   nicotine  14 mg Transdermal Daily   [START ON 01/24/2022] pantoprazole  40  mg Intravenous Q12H   pantoprazole (PROTONIX) IV  40 mg Intravenous Once   pneumococcal 20-valent conjugate vaccine  0.5 mL Intramuscular Tomorrow-1000   pravastatin  40 mg Oral Daily   vitamin B-12  1,000 mcg Oral Daily   Continuous Infusions:  pantoprazole 8 mg/hr (01/23/22 0819)    LOS: 2 days   Roxan Hockey M.D on 01/23/2022 at 4:25 PM  Go to [email or web address] - for contact info  [facility] - Office  [phone]  If 7PM-7AM, please contact night-coverage [email or web address] Password [facility] 01/23/2022, 4:25 PM

## 2022-01-24 ENCOUNTER — Telehealth: Payer: Self-pay | Admitting: Gastroenterology

## 2022-01-24 ENCOUNTER — Encounter (HOSPITAL_COMMUNITY): Payer: Self-pay | Admitting: Internal Medicine

## 2022-01-24 DIAGNOSIS — D649 Anemia, unspecified: Secondary | ICD-10-CM | POA: Diagnosis not present

## 2022-01-24 DIAGNOSIS — K922 Gastrointestinal hemorrhage, unspecified: Secondary | ICD-10-CM | POA: Diagnosis not present

## 2022-01-24 LAB — BASIC METABOLIC PANEL
Anion gap: 5 (ref 5–15)
BUN: 18 mg/dL (ref 8–23)
CO2: 32 mmol/L (ref 22–32)
Calcium: 8.3 mg/dL — ABNORMAL LOW (ref 8.9–10.3)
Chloride: 99 mmol/L (ref 98–111)
Creatinine, Ser: 0.94 mg/dL (ref 0.44–1.00)
GFR, Estimated: >60 mL/min (ref >60)
Glucose, Bld: 100 mg/dL — ABNORMAL HIGH (ref 70–99)
Potassium: 4.2 mmol/L (ref 3.5–5.1)
Sodium: 136 mmol/L (ref 135–145)

## 2022-01-24 LAB — CBC
HCT: 26.7 % — ABNORMAL LOW (ref 36.0–46.0)
Hemoglobin: 7.9 g/dL — ABNORMAL LOW (ref 12.0–15.0)
MCH: 22.8 pg — ABNORMAL LOW (ref 26.0–34.0)
MCHC: 29.6 g/dL — ABNORMAL LOW (ref 30.0–36.0)
MCV: 76.9 fL — ABNORMAL LOW (ref 80.0–100.0)
Platelets: 122 10*3/uL — ABNORMAL LOW (ref 150–400)
RBC: 3.47 MIL/uL — ABNORMAL LOW (ref 3.87–5.11)
RDW: 19.9 % — ABNORMAL HIGH (ref 11.5–15.5)
WBC: 3.8 10*3/uL — ABNORMAL LOW (ref 4.0–10.5)
nRBC: 0 % (ref 0.0–0.2)

## 2022-01-24 MED ORDER — PANTOPRAZOLE SODIUM 40 MG PO TBEC
40.0000 mg | DELAYED_RELEASE_TABLET | Freq: Two times a day (BID) | ORAL | Status: DC
  Administered 2022-01-24 – 2022-01-26 (×4): 40 mg via ORAL
  Filled 2022-01-24 (×4): qty 1

## 2022-01-24 NOTE — Telephone Encounter (Signed)
Sarah Rich, patient needs hospital follow-up in 4 weeks. She was seen by Loma Sousa and myself. Thanks!

## 2022-01-24 NOTE — TOC Initial Note (Addendum)
Transition of Care [facility]) - Initial/Assessment Note    Patient Details  Name: Sarah Rich MRN: [id number] Date of Birth: [birth year]  [facility]) CM/SW Contact:    Ihor Gully, LCSW Phone Number: 01/24/2022, 4:28 PM  Clinical Narrative:                 Patient from home alone. Independent with ADLs. Hasnt drive in a year. Has cane and walker but does not use them. On home oxygen. PT recommends SNF. Son is agreeable to SNF. Referred to agencies of choice.  Met with patient and son at bedside. After much discussion and consideration patient is agreeable to SNF for short term rehab. Patient is vaccinated for COVID. Received vaccinations at [facility].   Expected Discharge Plan: Skilled Nursing Facility Barriers to Discharge: Continued Medical Work up   Patient Goals and CMS Choice        Expected Discharge Plan and Services Expected Discharge Plan: [place] Acute Care Choice: [place] Living arrangements for the past 2 months: Single Family Home                                      Prior Living Arrangements/Services Living arrangements for the past 2 months: Single Family Home Lives with:: Self Patient language and need for interpreter reviewed:: Yes        Need for Family Participation in Patient Care: Yes (Comment) Care giver support system in place?: Yes (comment)   Criminal Activity/Legal Involvement Pertinent to Current Situation/Hospitalization: No - Comment as needed  Activities of Daily Living Home Assistive Devices/Equipment: Oxygen, Shower chair with back ADL Screening (condition at time of admission) Patient's cognitive ability adequate to safely complete daily activities?: Yes Is the patient deaf or have difficulty hearing?: No Does the patient have difficulty seeing, even when wearing glasses/contacts?: Yes (reading glasses) Does the patient have difficulty concentrating, remembering,  or making decisions?: Yes Patient able to express need for assistance with ADLs?: Yes Does the patient have difficulty dressing or bathing?: Yes Independently performs ADLs?: Yes (appropriate for developmental age) Does the patient have difficulty walking or climbing stairs?: Yes Weakness of Legs: Both Weakness of Arms/Hands: None  Permission Sought/Granted Permission sought to share information with : Family Supports    Share Information with NAME: Lorelei Pont           Emotional Assessment       Orientation: : Oriented to Self, Oriented to Place Alcohol / Substance Use: Not Applicable Psych Involvement: No (comment)  Admission diagnosis:  Symptomatic anemia [D64.9] Patient Active Problem List   Diagnosis Date Noted   Microcytic anemia 01/21/2022   GI bleed 01/21/2022   Chronic leukopenia 01/21/2022   Hypoalbuminemia due to protein-calorie malnutrition ([place]) 01/21/2022   Mixed hyperlipidemia 01/21/2022   Malnutrition of moderate degree 01/21/2022   Symptomatic anemia 01/20/2022   Protein-calorie malnutrition, severe ([place]) 01/06/2022   Adult failure to thrive 01/06/2022   Chronic respiratory failure with hypoxia ([place]) 01/30/2020   Pulmonary nodule 05/29/2019   Pneumothorax 05/01/2018   Pneumothorax on right 04/30/2018   COPD (chronic obstructive pulmonary disease) ([place]) 04/30/2018   Primary cancer of left lower lobe of lung ([place]) 04/30/2018   Atypical chest pain 04/30/2018   Pleuritic chest pain 04/30/2018   Current every day smoker 04/30/2018   Hypercholesteremia    COPD exacerbation (  [place]) 10/20/2015   Essential hypertension 10/20/2015   PCP:  Lemmie Evens, MD Pharmacy:   [place], [place] - [place] [address] [place] [address] [place] [address] Phone: [phone] Fax: [phone]     Social Determinants of Health (SDOH) Interventions    Readmission Risk Interventions     View : No data to display.

## 2022-01-24 NOTE — NC FL2 (Signed)
[place] LEVEL OF CARE SCREENING TOOL     IDENTIFICATION  Patient Name: Sarah Rich Birthdate: May 15, 1946 Sex: female Admission Date (Current Location): 01/20/2022  [facility] and [place] Number:  [employer] and Address:  [facility] [address] [place]      Provider Number: [id number]  Attending Physician Name and Address:  Roxan Hockey, MD  Relative Name and Phone Number:  Daesha, [place] [phone])   [phone]    Current Level of Care: Hospital Recommended Level of Care: [place] Prior Approval Number:    Date Approved/Denied:   PASRR Number: [id number]  Discharge Plan: SNF    Current Diagnoses: Patient Active Problem List   Diagnosis Date Noted   Microcytic anemia 01/21/2022   GI bleed 01/21/2022   Chronic leukopenia 01/21/2022   Hypoalbuminemia due to protein-calorie malnutrition ([place]) 01/21/2022   Mixed hyperlipidemia 01/21/2022   Malnutrition of moderate degree 01/21/2022   Symptomatic anemia 01/20/2022   Protein-calorie malnutrition, severe ([place]) 01/06/2022   Adult failure to thrive 01/06/2022   Chronic respiratory failure with hypoxia ([place]) 01/30/2020   Pulmonary nodule 05/29/2019   Pneumothorax 05/01/2018   Pneumothorax on right 04/30/2018   COPD (chronic obstructive pulmonary disease) ([place]) 04/30/2018   Primary cancer of left lower lobe of lung ([place]) 04/30/2018   Atypical chest pain 04/30/2018   Pleuritic chest pain 04/30/2018   Current every day smoker 04/30/2018   Hypercholesteremia    COPD exacerbation ([place]) 10/20/2015   Essential hypertension 10/20/2015    Orientation RESPIRATION BLADDER Height & Weight     Self, Place  O2 (2L) Continent Weight: 111 lb 5.3 oz (50.5 kg) Height:  5\' 6"  (167.6 cm)  BEHAVIORAL SYMPTOMS/MOOD NEUROLOGICAL BOWEL NUTRITION STATUS      Continent Diet (heart hearthy)  AMBULATORY STATUS COMMUNICATION OF NEEDS Skin   Limited Assist Verbally  Normal                       Personal Care Assistance Level of Assistance  Bathing, Feeding, Dressing Bathing Assistance: Limited assistance Feeding assistance: Independent Dressing Assistance: Limited assistance     Functional Limitations Info  Sight, Speech, Hearing Sight Info: Adequate Hearing Info: Adequate Speech Info: Adequate    SPECIAL CARE FACTORS FREQUENCY  PT (By licensed PT)     PT Frequency: 5x/week              Contractures Contractures Info: Not present    Additional Factors Info  Code Status, Allergies Code Status Info: Full Code Allergies Info: Sulfa Antibiotics, Codeine, Levaquin           Current Medications (01/24/2022):  This is the current hospital active medication list Current Facility-Administered Medications  Medication Dose Route Frequency Provider Last Rate Last Admin   acetaminophen (TYLENOL) tablet 650 mg  650 mg Oral Q6H PRN Adefeso, Oladapo, DO       Or   acetaminophen (TYLENOL) suppository 650 mg  650 mg Rectal Q6H PRN Adefeso, Oladapo, DO       albuterol (VENTOLIN HFA) 108 (90 Base) MCG/ACT inhaler 2 puff  2 puff Inhalation Q6H PRN Adefeso, Oladapo, DO       amLODipine (NORVASC) tablet 10 mg  10 mg Oral Daily Emokpae, Courage, MD   10 mg at 01/24/22 0842   diazepam (VALIUM) tablet 2 mg  2 mg Oral QHS Emokpae, Courage, MD   2 mg at 01/23/22 2144   feeding supplement (ENSURE ENLIVE / ENSURE  PLUS) liquid 237 mL  237 mL Oral BID BM Adefeso, Oladapo, DO   237 mL at 94/85/46 2703   folic acid (FOLVITE) tablet 1 mg  1 mg Oral Daily Adefeso, Oladapo, DO   1 mg at 01/24/22 0843   hydrALAZINE (APRESOLINE) injection 10 mg  10 mg Intravenous Q6H PRN Emokpae, Courage, MD       LORazepam (ATIVAN) injection 0.5 mg  0.5 mg Intravenous Q6H PRN Emokpae, Courage, MD       mometasone-formoterol (DULERA) 200-5 MCG/ACT inhaler 2 puff  2 puff Inhalation BID Roxan Hockey, MD   2 puff at 01/24/22 0709   nicotine (NICODERM CQ - dosed in mg/24 hours)  patch 14 mg  14 mg Transdermal Daily Emokpae, Courage, MD   14 mg at 01/24/22 0845   pantoprazole (PROTONIX) EC tablet 40 mg  40 mg Oral BID AC Annitta Needs, NP       pantoprazole (PROTONIX) injection 40 mg  40 mg Intravenous Once Adefeso, Oladapo, DO       pneumococcal 20-valent conjugate vaccine (PREVNAR 20) injection 0.5 mL  0.5 mL Intramuscular Tomorrow-1000 Emokpae, Courage, MD       pravastatin (PRAVACHOL) tablet 40 mg  40 mg Oral Daily Adefeso, Oladapo, DO   40 mg at 01/24/22 5009   vitamin B-12 (CYANOCOBALAMIN) tablet 1,000 mcg  1,000 mcg Oral Daily Adefeso, Oladapo, DO   1,000 mcg at 01/24/22 3818     Discharge Medications: Please see discharge summary for a list of discharge medications.  Relevant Imaging Results:  Relevant Lab Results:   Additional Information SSN [id number]. Per son, patient is vaccinated for COVID.  Horst Ostermiller, Clydene Pugh, LCSW

## 2022-01-24 NOTE — Progress Notes (Signed)
Gastroenterology Progress Note   Referring Provider: No ref. provider found Primary Care Physician:  Lemmie Evens, MD Primary Gastroenterologist:  Dr. Oneida Alar previously  Patient ID: Sarah Rich; [id number]; 27-May-1946    Subjective   Denies overt GI bleeding. No abdominal pain. +coughing when entering room. Does not know the year or the city.    Objective   Vital signs in last 24 hours Temp:  [97.7 F (36.5 C)-98.4 F (36.9 C)] 98.4 F (36.9 C) (05/22 0524) Pulse Rate:  [76-82] 80 (05/22 0524) Resp:  [18-20] 18 (05/22 0524) BP: (104-155)/(62-80) 113/71 (05/22 0524) SpO2:  [96 %-100 %] 99 % (05/22 0709) Weight:  [50.5 kg] 50.5 kg (05/22 0554) Last BM Date : 01/24/22  Physical Exam General:   Alert and oriented to person and situation only. Does not know the year or city. Believes it is 2024, [place].  Abdomen:  Bowel sounds present, soft, non-tender, non-distended. No HSM or hernias noted. No rebound or guarding. No masses appreciated  Extremities:  Without  edema.  Intake/Output from previous day: 05/21 0701 - 05/22 0700 In: 1185 [P.O.:1185] Out: 2 [Urine:1; Stool:1] Intake/Output this shift: No intake/output data recorded.  Lab Results  Recent Labs    01/22/22 0432 01/23/22 0549 01/24/22 0340  WBC 2.2* 2.5* 3.8*  HGB 8.3* 7.7* 7.9*  HCT 29.5* 26.8* 26.7*  PLT 212 221 122*   BMET Recent Labs    01/22/22 0432 01/24/22 0340  NA 141 136  K 4.3 4.2  CL 99 99  CO2 36* 32  GLUCOSE 75 100*  BUN 11 18  CREATININE 0.79 0.94  CALCIUM 8.9 8.3*    Studies/Results CT Chest Wo Contrast  Result Date: 01/20/2022 CLINICAL DATA:  Evaluate lung nodule seen on recent chest x-ray EXAM: CT CHEST WITHOUT CONTRAST TECHNIQUE: Multidetector CT imaging of the chest was performed following the standard protocol without IV contrast. RADIATION DOSE REDUCTION: This exam was performed according to the departmental dose-optimization program which includes automated  exposure control, adjustment of the mA and/or kV according to patient size and/or use of iterative reconstruction technique. COMPARISON:  Chest x-ray from earlier in the same day as well as CT from 09/02/2021 FINDINGS: Cardiovascular: Somewhat limited due to lack of IV contrast. Atherosclerotic calcifications are noted of the thoracic aorta. No aneurysmal dilatation is seen. Heavy coronary calcifications are noted. No cardiac enlargement is seen. Cardiac blood pool is decreased in attenuation which may be related to underlying anemia. Mediastinum/Nodes: Thoracic inlet is within normal limits. No hilar or mediastinal adenopathy is noted. The esophagus is within normal limits. Lungs/Pleura: Lungs are well aerated bilaterally. The previously seen nodule in the left lower lobe is again seen and stable in appearance. Some persistent traction upon the fissure is seen but stable. 17 mm nodule is noted along the posterior aspect of the pericardium in the medial aspect of the left lobe of the liver best seen on image number 125 of series 4. This is stable in appearance from the prior exam. Ground-glass nodule is noted in the posterior left lower lobe best seen on image number 106 of series 4 measuring up to 14 mm. This is slightly larger than that seen on the prior exam and may be related to technical factors. The ground-glass nodule seen previously in the right middle lobe is again identified measuring up to 18 mm. The solid component however appears more prominent now measuring up to 6 mm. No other significant nodular changes in the right lung are noted.  Diffuse emphysematous changes are noted throughout both lungs. No effusion is noted. Upper Abdomen: Visualized upper abdomen shows no acute abnormality. Musculoskeletal: No rib abnormality is noted. Degenerative changes of the thoracic spine are seen. No acute abnormality is noted. IMPRESSION: Slight increase in the solid component in a ground-glass nodule in the right  middle lobe. A portion of this corresponds to the changes on recent chest x-ray although the majority of the density is felt to be related to the overlying nipple shadow given its location. Given the increase in solid component however, PET-CT is recommended for further evaluation. Stable nodules in the left lower lobe, one superiorly with pleural retraction and a second along the posterior aspect of the pericardium. These can also be followed on subsequent exams. Ground-glass nodule within the left lower lobe slightly more prominent than that seen on the prior exam. This could also be evaluated on follow-up imaging. Aortic Atherosclerosis (ICD10-I70.0) and Emphysema (ICD10-J43.9). Electronically Signed   By: Inez Catalina M.D.   On: 01/20/2022 22:54   DG Chest Port 1 View  Result Date: 01/20/2022 CLINICAL DATA:  Weakness and shortness of breath for 1 day EXAM: PORTABLE CHEST 1 VIEW COMPARISON:  09/02/2021 FINDINGS: Cardiac shadow is within normal limits. Aortic calcifications are noted. The lungs are hyperinflated. Nodular density is noted in the right lung base measuring approximately 16 mm. This lies in a similar area to a previously seen right middle lobe ground-glass nodule. Left perihilar nodule is noted in the lower lobe similar to that seen on prior CT. No other focal infiltrate or nodular changes are seen. IMPRESSION: Solid appearing nodule over the right lung base in an area of previous ground-glass nodularity. Right nipple shadow projects in this region as well and may contribute to this nodular density. Noncontrast CT would be helpful for further clarification. Stable left lower lobe nodule unchanged from the prior CT. Electronically Signed   By: Inez Catalina M.D.   On: 01/20/2022 21:37    Assessment  76 y.o. female with a history of  asthma, COPD, HLD, HTN, dementia, lung cancer with chronic respiratory failure with hypoxia, presenting with profound symptomatic anemia.   IDA: Hgb 4.6 on  admission, ferritin 3, receiving 2 units PRBCs. Hgb 7.9 today, improved from yesterday 7.7. Colonoscopy was attempted but aborted due to poor prep. EGD with an oozing Dieulafoy lesion and stigmata of recent bleeding s/p clip placement. Hematin found in gastric body. She's had no overt GI bleeding. Will need outpatient colonoscopy and EGD in several months.     Plan / Recommendations  Change PPI to oral BID EGD/colonoscopy as outpatient in 2 months GI signing off and will arrange outpatient follow-up.     LOS: 3 days    01/24/2022, 11:21 AM  Annitta Needs, PhD, ANP-BC [facility] Gastroenterology

## 2022-01-24 NOTE — Progress Notes (Signed)
PROGRESS NOTE     Sarah Rich, is a 76 y.o. female, DOB - [birth year], PIR:[id number]  Admit date - 01/20/2022   Admitting Physician Shala Baumbach Denton Brick, MD  Outpatient Primary MD for the patient is Lemmie Evens, MD  LOS - 3  Chief Complaint  Patient presents with   Weakness       Brief Narrative: 76 y.o. female with medical history significant of COPD, hyperlipidemia, hypertension, chronic respiratory failure with hypoxia on supplemental oxygen via [place] at 5-6 L/min, lung cancer admitted on 01/20/2022 with acute symptomatic anemia with hemoglobin down to 4.6 from a baseline usually between 13 and 14 -EGD on 01/22/2022 shows Coffee-ground material noted in the stomach along with fresh blood trace to Diulafoy at gastric body towards the anterior wall with active bleeding.  2 clips applied to control bleeding. -Hgb appears to have stabilized -Awaiting transfer to SNF rehab   -Assessment and Plan: 1)Acute Severe symptomatic microcytic Anemia--- due to ABLA in the setting of upper GI bleed -- admission HGB 4.6,  -Patient was transfused 2 units of PRBC Hgb--7.9 -GI consult appreciated -Continue IV Protonix -EGD on 01/22/2022 showed Coffee-ground material noted in the stomach along with fresh blood trace to Diulafoy at gastric body towards the anterior wall with active bleeding.  2 clips applied to control bleeding. -Colonoscopy on 01/22/2022 was limited by poor prep.  No acute findings -Repeat EGD and colonoscopy as outpatient in 2 months advised  2)Pulmonary Nodule--- patient with prior history of lung cancer outpatient PET scan advised to rule out underlying malignancy  3) chronic hypoxic respiratory failure--- prior history of lung cancer, COPD and ongoing tobacco use -Continue supplemental oxygen --- currently weaned down to 2 L of oxygen via nasal cannula after transfusion -Continue bronchodilators  4)HTN--stopped atenolol given significant COPD,  C/n  amlodipine instead  may use  IV Hydralazine 10 mg  Every 4 hours Prn for systolic blood pressure over 160 mmhg  5)HLD--- continue pravastatin  6) social/ethics-----plan of care and advanced directives discussed with patient and son Simona Huh, patient's grand-daughter-in-law Nira Conn and patient's daughter-in-law Joaquim Lai as well as patient's granddaughter Anderson [place] from [place] -Patient remains a full code without limitations to treatment  7)Dementia --- occasional sundowning confusion and behavioral issues -May use lorazepam as needed , PTA patient was on Valium, will have scheduled Valium nightly  8)Generalized weakness and deconditioning--- PT eval appreciated recommends SNF rehab  Disposition/Need for in-Hospital Stay- patient unable to be discharged at this time due to --- severe symptomatic anemia requiring transfusion and endoluminal evaluation as well as IV Protonix -Anticipate discharge to SNF on 01/25/2022  Status is: Inpatient   Disposition: The patient is from: Home              Anticipated d/c is to: SNF              Anticipated d/c date is: 1 day              Patient currently is not medically stable to d/c. Barriers: Not Clinically Stable-   Code Status :  -  Code Status: Full Code   Family Communication:     (patient is alert, awake and coherent)  -Discussed with patient and son Simona Huh, patient's grand-daughter-in-law Nira Conn and patient's daughter-in-law Joaquim Lai  DVT Prophylaxis  :   - SCDs  SCDs Start: 01/20/22 2318   Lab Results  Component Value Date   PLT 122 (L) 01/24/2022    Inpatient Medications  Scheduled Meds:  amLODipine  10 mg  Oral Daily   diazepam  2 mg Oral QHS   feeding supplement  237 mL Oral BID BM   folic acid  1 mg Oral Daily   mometasone-formoterol  2 puff Inhalation BID   nicotine  14 mg Transdermal Daily   pantoprazole  40 mg Oral BID AC   pantoprazole (PROTONIX) IV  40 mg Intravenous Once   pneumococcal 20-valent conjugate vaccine  0.5 mL Intramuscular Tomorrow-1000    pravastatin  40 mg Oral Daily   vitamin B-12  1,000 mcg Oral Daily   Continuous Infusions:   PRN Meds:.acetaminophen **OR** acetaminophen, albuterol, hydrALAZINE, LORazepam   Anti-infectives (From admission, onward)    None      Subjective: Sarah Rich today has no fevers, no emesis,  No chest pain,   -Family at bedside, -Oral intake is not great  Objective: Vitals:   01/24/22 0524 01/24/22 0554 01/24/22 0709 01/24/22 1425  BP: 113/71   135/63  Pulse: 80   77  Resp: 18   18  Temp: 98.4 F (36.9 C)   97.8 F (36.6 C)  TempSrc: Oral   Oral  SpO2: 100%  99% 100%  Weight:  50.5 kg    Height:       No intake or output data in the 24 hours ending 01/24/22 1806  Filed Weights   01/22/22 0854 01/24/22 0554  Weight: 51.7 kg 50.5 kg    Physical Exam  Gen:- Awake Alert, no conversational dyspnea HEENT:- [place].AT, No sclera icterus Nose- [place] 2 L/min Neck-Supple Neck,No JVD,.  Lungs-fair air movement, no wheezing CV- S1, S2 normal, regular  Abd-  +ve B.Sounds, Abd Soft, No tenderness,    Extremity/Skin:- No  edema, pedal pulses present  Psych-some memory and cognitive challenges at baseline, more cooperative  neuro-generalized weakness, no new focal deficits, no tremors  Data Reviewed: I have personally reviewed following labs and imaging studies  CBC: Recent Labs  Lab 01/20/22 2102 01/21/22 0703 01/21/22 1553 01/22/22 0432 01/23/22 0549 01/24/22 0340  WBC 2.2* 2.1* 2.9* 2.2* 2.5* 3.8*  NEUTROABS 1.6*  --   --   --   --   --   HGB 4.6* 7.7* 7.9* 8.3* 7.7* 7.9*  HCT 17.7* 26.0* 26.6* 29.5* 26.8* 26.7*  MCV 72.8* 76.5* 76.4* 78.2* 77.9* 76.9*  PLT 216 195 204 212 221 211*   Basic Metabolic Panel: Recent Labs  Lab 01/20/22 2102 01/21/22 0703 01/22/22 0432 01/24/22 0340  NA 138 138 141 136  K 3.8 3.8 4.3 4.2  CL 93* 95* 99 99  CO2 34* 37* 36* 32  GLUCOSE 108* 81 75 100*  BUN 18 17 11 18   CREATININE 0.98 0.86 0.79 0.94  CALCIUM 8.6* 8.5* 8.9 8.3*   MG  --  1.9  --   --   PHOS  --  3.9  --   --    GFR: Estimated Creatinine Clearance: 40.6 mL/min (by C-G formula based on SCr of 0.94 mg/dL). Liver Function Tests: Recent Labs  Lab 01/20/22 2102 01/21/22 0703  AST 18 18  ALT 10 11  ALKPHOS 52 55  BILITOT 0.3 0.8  PROT 5.8* 5.7*  ALBUMIN 3.3* 3.3*   Cardiac Enzymes:  Radiology Studies: No results found.   Scheduled Meds:  amLODipine  10 mg Oral Daily   diazepam  2 mg Oral QHS   feeding supplement  237 mL Oral BID BM   folic acid  1 mg Oral Daily   mometasone-formoterol  2 puff Inhalation BID  nicotine  14 mg Transdermal Daily   pantoprazole  40 mg Oral BID AC   pantoprazole (PROTONIX) IV  40 mg Intravenous Once   pneumococcal 20-valent conjugate vaccine  0.5 mL Intramuscular Tomorrow-1000   pravastatin  40 mg Oral Daily   vitamin B-12  1,000 mcg Oral Daily   Continuous Infusions:    LOS: 3 days   Roxan Hockey M.D on 01/24/2022 at 6:06 PM  Go to [email or web address] - for contact info  [facility] - Office  [phone]  If 7PM-7AM, please contact night-coverage [email or web address] Password [facility] 01/24/2022, 6:06 PM

## 2022-01-25 ENCOUNTER — Encounter: Payer: Self-pay | Admitting: Internal Medicine

## 2022-01-25 ENCOUNTER — Inpatient Hospital Stay (HOSPITAL_COMMUNITY): Payer: Medicare Other

## 2022-01-25 DIAGNOSIS — D649 Anemia, unspecified: Secondary | ICD-10-CM | POA: Diagnosis not present

## 2022-01-25 LAB — PREPARE RBC (CROSSMATCH)

## 2022-01-25 LAB — CBC
HCT: 24.2 % — ABNORMAL LOW (ref 36.0–46.0)
Hemoglobin: 7 g/dL — ABNORMAL LOW (ref 12.0–15.0)
MCH: 22.8 pg — ABNORMAL LOW (ref 26.0–34.0)
MCHC: 28.9 g/dL — ABNORMAL LOW (ref 30.0–36.0)
MCV: 78.8 fL — ABNORMAL LOW (ref 80.0–100.0)
Platelets: 185 10*3/uL (ref 150–400)
RBC: 3.07 MIL/uL — ABNORMAL LOW (ref 3.87–5.11)
RDW: 20.7 % — ABNORMAL HIGH (ref 11.5–15.5)
WBC: 4.9 10*3/uL (ref 4.0–10.5)
nRBC: 0 % (ref 0.0–0.2)

## 2022-01-25 LAB — BASIC METABOLIC PANEL
Anion gap: 6 (ref 5–15)
BUN: 18 mg/dL (ref 8–23)
CO2: 31 mmol/L (ref 22–32)
Calcium: 8.3 mg/dL — ABNORMAL LOW (ref 8.9–10.3)
Chloride: 98 mmol/L (ref 98–111)
Creatinine, Ser: 0.82 mg/dL (ref 0.44–1.00)
GFR, Estimated: >60 mL/min (ref >60)
Glucose, Bld: 85 mg/dL (ref 70–99)
Potassium: 4 mmol/L (ref 3.5–5.1)
Sodium: 135 mmol/L (ref 135–145)

## 2022-01-25 MED ORDER — SODIUM CHLORIDE 0.9% IV SOLUTION: Freq: Once | INTRAVENOUS | Status: AC

## 2022-01-25 MED ORDER — FUROSEMIDE 10 MG/ML IJ SOLN
20.0000 mg | Freq: Once | INTRAMUSCULAR | Status: AC
  Administered 2022-01-25: 20 mg via INTRAVENOUS
  Filled 2022-01-25: qty 2

## 2022-01-25 MED ORDER — IOHEXOL 350 MG/ML SOLN
100.0000 mL | Freq: Once | INTRAVENOUS | Status: AC | PRN
  Administered 2022-01-25: 80 mL via INTRAVENOUS

## 2022-01-25 NOTE — Progress Notes (Signed)
Patient pleasant with some confusion. Receiving 1 unit RBCs.

## 2022-01-25 NOTE — Progress Notes (Signed)
PROGRESS NOTE     Sarah Rich, is a 76 y.o. female, DOB - [birth year], TKP:[id number]  Admit date - 01/20/2022   Admitting Physician Travonne Schowalter Denton Brick, MD  Outpatient Primary MD for the patient is Lemmie Evens, MD  LOS - 4  Chief Complaint  Patient presents with   Weakness       Brief Narrative: 76 y.o. 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  2 clips applied to control bleeding.    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  2 clips applied to control bleeding. -Colonoscopy on 01/22/2022 was limited by poor prep.  No acute findings -Hgb dropped on 01/25/2022--CT angio without  active GI bleed -We will transfuse 1 unit of PRBC on 01/25/2022 for a total of 3 units of PRBC this admission -Possible discharge to SNF on 01/26/2022 if Hgb stable posttransfusion --Repeat EGD and colonoscopy as outpatient in 2 months advised  2)Pulmonary Nodule--- patient with prior history of lung cancer outpatient PET scan advised to rule out underlying malignancy  3) chronic hypoxic respiratory failure--- prior history of lung cancer, COPD and ongoing tobacco use -Continue supplemental oxygen --- currently weaned down to 2 L of oxygen via nasal cannula  after transfusion -Continue bronchodilators  4)HTN--stopped atenolol given significant COPD,  C/n  amlodipine instead  may use IV Hydralazine 10 mg  Every 4 hours Prn for systolic blood pressure over 160 mmhg  5)HLD--- continue pravastatin  6) social/ethics-----plan of care and advanced directives discussed with patient and son Simona Huh, patient's grand-daughter-in-law Nira Conn and patient's daughter-in-law Joaquim Lai as well as patient's granddaughter Anderson Malta from [place] -Patient remains a full code without limitations to treatment  7)Dementia --- occasional sundowning confusion and behavioral issues -May use lorazepam as needed , PTA patient was on Valium, will have scheduled Valium nightly  8)Generalized weakness and deconditioning--- PT eval appreciated recommends SNF rehab  9) leukopenia--- resolved, no evidence of acute infection  Disposition/Need for in-Hospital Stay- patient unable to be discharged at this time due to --- severe symptomatic anemia requiring transfusion and endoluminal evaluation as well as IV Protonix -Anticipate discharge to SNF on 01/26/2022 if Hgb Stable  Status is: Inpatient   Disposition: The patient is from: Home              Anticipated d/c is to: SNF              Anticipated d/c date is: 1 day              Patient currently is not medically stable to d/c. Barriers: Not Clinically Stable-  -Possible discharge to SNF on 01/26/2022 if Hgb stable posttransfusion   Code Status :  -  Code Status: Full Code   Family Communication:     (patient is alert, awake and coherent)  -Discussed with patient and son Simona Huh, patient's  grand-daughter-in-law Nira Conn and patient's daughter-in-law Joaquim Lai  DVT Prophylaxis  :   - SCDs  SCDs Start: 01/20/22 2318   Lab Results  Component Value Date   PLT 185 01/25/2022    Inpatient Medications  Scheduled Meds:  amLODipine  10 mg Oral Daily   diazepam  2 mg Oral QHS   feeding supplement  237 mL Oral BID BM   folic  acid  1 mg Oral Daily   mometasone-formoterol  2 puff Inhalation BID   nicotine  14 mg Transdermal Daily   pantoprazole  40 mg Oral BID AC   pantoprazole (PROTONIX) IV  40 mg Intravenous Once   pneumococcal 20-valent conjugate vaccine  0.5 mL Intramuscular Tomorrow-1000   pravastatin  40 mg Oral Daily   vitamin B-12  1,000 mcg Oral Daily   Continuous Infusions:   PRN Meds:.acetaminophen **OR** acetaminophen, albuterol, hydrALAZINE, LORazepam   Anti-infectives (From admission, onward)    None      Subjective: Sarah Rich today has no fevers, no emesis,  No chest pain,   Hgb dropping.... Patient denies abdominal pain or any bleeding concerns -No BM today  Objective: Vitals:   01/25/22 1242 01/25/22 1309 01/25/22 1342 01/25/22 1445  BP: (!) 144/71 (!) 143/68 130/67 (!) 149/75  Pulse: 76 83 80 78  Resp: 16 16 17 16   Temp: 97.8 F (36.6 C) 97.8 F (36.6 C) 97.8 F (36.6 C) 98.4 F (36.9 C)  TempSrc: Oral  Oral Oral  SpO2: 100%  100% 100%  Weight:      Height:        Intake/Output Summary (Last 24 hours) at 01/25/2022 1603 Last data filed at 01/25/2022 1445 Gross per 24 hour  Intake 942 ml  Output --  Net 942 ml    Filed Weights   01/22/22 0854 01/24/22 0554 01/25/22 0516  Weight: 51.7 kg 50.5 kg 51.8 kg    Physical Exam Gen:- Awake Alert, no conversational dyspnea HEENT:- [place].AT, No sclera icterus Nose- [place] 2 L/min (was on Oxygen at Home) Neck-Supple Neck,No JVD,.  Lungs-fair air movement, no wheezing CV- S1, S2 normal, regular  Abd-  +ve B.Sounds, Abd Soft, No tenderness,    Extremity/Skin:- No  edema, pedal pulses present  Psych-some baseline memory and cognitive challenges at baseline, more cooperative  neuro-Generalized weakness, no new focal deficits, no tremors  Data Reviewed: I have personally reviewed following labs and imaging studies  CBC: Recent Labs  Lab 01/20/22 2102 01/21/22 0703 01/21/22 1553 01/22/22 0432 01/23/22 0549  01/24/22 0340 01/25/22 0528  WBC 2.2*   < > 2.9* 2.2* 2.5* 3.8* 4.9  NEUTROABS 1.6*  --   --   --   --   --   --   HGB 4.6*   < > 7.9* 8.3* 7.7* 7.9* 7.0*  HCT 17.7*   < > 26.6* 29.5* 26.8* 26.7* 24.2*  MCV 72.8*   < > 76.4* 78.2* 77.9* 76.9* 78.8*  PLT 216   < > 204 212 221 122* 185   < > = values in this interval not displayed.   Basic Metabolic Panel: Recent Labs  Lab 01/20/22 2102 01/21/22 0703 01/22/22 0432 01/24/22 0340 01/25/22 0528  NA 138 138 141 136 135  K 3.8 3.8 4.3 4.2 4.0  CL 93* 95* 99 99 98  CO2 34* 37* 36* 32 31  GLUCOSE 108* 81 75 100* 85  BUN 18 17 11 18 18   CREATININE 0.98 0.86 0.79 0.94 0.82  CALCIUM 8.6* 8.5* 8.9  8.3* 8.3*  MG  --  1.9  --   --   --   PHOS  --  3.9  --   --   --    GFR: Estimated Creatinine Clearance: 47.7 mL/min (by C-G formula based on SCr of 0.82 mg/dL). Liver Function Tests: Recent Labs  Lab 01/20/22 2102 01/21/22 0703  AST 18 18  ALT 10 11  ALKPHOS 52 55  BILITOT 0.3 0.8  PROT 5.8* 5.7*  ALBUMIN 3.3* 3.3*   Cardiac Enzymes:  Radiology Studies: CT ANGIO GI BLEED  Result Date: 01/25/2022 CLINICAL DATA:  Left upper quadrant pain. Coffee ground material in stomach with light brown stools. Left upper quadrant pain. Evaluate for GI bleed. EXAM: CTA ABDOMEN AND PELVIS WITHOUT AND WITH CONTRAST TECHNIQUE: Multidetector CT imaging of the abdomen and pelvis was performed using the standard protocol during bolus administration of intravenous contrast. Multiplanar reconstructed images and MIPs were obtained and reviewed to evaluate the vascular anatomy. RADIATION DOSE REDUCTION: This exam was performed according to the departmental dose-optimization program which includes automated exposure control, adjustment of the mA and/or kV according to patient size and/or use of iterative reconstruction technique. CONTRAST:  30mL OMNIPAQUE IOHEXOL 350 MG/ML SOLN COMPARISON:  PET-CT 07/16/2018 MRI abdomen 08/17/2011 FINDINGS: VASCULAR Aorta:  Aortic atherosclerotic calcifications. Normal caliber aorta without aneurysm, dissection, vasculitis or significant stenosis. Celiac: Patent without evidence of aneurysm, dissection, vasculitis or significant stenosis. SMA: Calcified plaque identified at the origin with approximately 50% stenosis. No evidence of aneurysm, dissection, or vasculitis. Renals: Both renal arteries are patent without evidence of aneurysm, dissection, vasculitis, fibromuscular dysplasia or significant stenosis. IMA: Patent without evidence of aneurysm, dissection, vasculitis or significant stenosis. Inflow: Patent without evidence of aneurysm, dissection, vasculitis or significant stenosis. Proximal Outflow: Bilateral common femoral and visualized portions of the superficial and profunda femoral arteries are patent without evidence of aneurysm, dissection, vasculitis or significant stenosis. Veins: No obvious venous abnormality within the limitations of this arterial phase study. Review of the MIP images confirms the above findings. NON-VASCULAR Lower chest: Small bilateral pleural effusions are identified Hepatobiliary: Hemangioma is identified within the lateral segment of left hepatic lobe measuring 1.5 cm, image 21/15. Increased from previous MRI dated 08/17/2011 when this measured 1.1 cm. Cyst within right hepatic lobe measures 8 mm. No suspicious liver lesions. Gallbladder appears surgically absent. No bile duct dilatation. Pancreas: Unremarkable. No pancreatic ductal dilatation or surrounding inflammatory changes. Spleen: Normal in size without focal abnormality. Adrenals/Urinary Tract: Normal adrenal glands. Bosniak class 1 and 2 left kidney cysts are noted. The largest arises off the inferior pole measuring 2 cm. No follow-up recommended. No signs of nephrolithiasis or hydronephrosis. Urinary bladder is unremarkable. Stomach/Bowel: Endo clip identified within the gastric lumen. No signs of intraluminal extravasation of IV contrast  material to suggest active GI bleeding at this time. No significant bowel wall thickening, inflammation, or distension. Lymphatic: No signs of abdominopelvic adenopathy. Reproductive: Status post hysterectomy. No adnexal masses. Other: No free fluid or fluid collections. No signs of pneumoperitoneum. Musculoskeletal: Lumbar degenerative disc disease and facet arthropathy. No acute abnormality. IMPRESSION: 1. No evidence for active GI bleeding at this time. 2. 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 3. Small bilateral pleural effusions. 4. Left hepatic lobe hemangioma. 5. Aortic Atherosclerosis (ICD10-I70.0). Electronically Signed   By: Kerby Moors M.D.   On: 01/25/2022 12:52    Scheduled Meds:  amLODipine  10 mg Oral Daily   diazepam  2 mg Oral QHS  feeding supplement  237 mL Oral BID BM   folic acid  1 mg Oral Daily   mometasone-formoterol  2 puff Inhalation BID   nicotine  14 mg Transdermal Daily   pantoprazole  40 mg Oral BID AC   pantoprazole (PROTONIX) IV  40 mg Intravenous Once   pneumococcal 20-valent conjugate vaccine  0.5 mL Intramuscular Tomorrow-1000   pravastatin  40 mg Oral Daily   vitamin B-12  1,000 mcg Oral Daily   Continuous Infusions:   LOS: 4 days   Roxan Hockey M.D on 01/25/2022 at 4:03 PM  Go to [email or web address] - for contact info  [facility] - Office  [phone]  If 7PM-7AM, please contact night-coverage [email or web address] Password [facility] 01/25/2022, 4:03 PM

## 2022-01-25 NOTE — Progress Notes (Signed)
PT Cancellation Note  Patient Details Name: Sarah Rich MRN: [id number] DOB: [birth year]   Cancelled Treatment:    Reason Eval/Treat Not Completed: Other (comment) (Patient receiving blood); will check back if time permits.  2:37 PM, 01/25/22 Mearl Latin PT, DPT Physical Therapist at [facility]

## 2022-01-26 DIAGNOSIS — D649 Anemia, unspecified: Secondary | ICD-10-CM | POA: Diagnosis not present

## 2022-01-26 LAB — TYPE AND SCREEN
ABO/RH(D): A POS
Antibody Screen: NEGATIVE
Unit division: 0

## 2022-01-26 LAB — BPAM RBC
Blood Product Expiration Date: 202306142359
ISSUE DATE / TIME: 202305231236
Unit Type and Rh: 6200

## 2022-01-26 LAB — IRON AND TIBC
Iron: 28 ug/dL (ref 28–170)
Saturation Ratios: 8 % — ABNORMAL LOW (ref 10.4–31.8)
TIBC: 340 ug/dL (ref 250–450)
UIBC: 312 ug/dL

## 2022-01-26 LAB — CBC
HCT: 28.5 % — ABNORMAL LOW (ref 36.0–46.0)
Hemoglobin: 8.2 g/dL — ABNORMAL LOW (ref 12.0–15.0)
MCH: 22.7 pg — ABNORMAL LOW (ref 26.0–34.0)
MCHC: 28.8 g/dL — ABNORMAL LOW (ref 30.0–36.0)
MCV: 78.9 fL — ABNORMAL LOW (ref 80.0–100.0)
Platelets: 170 10*3/uL (ref 150–400)
RBC: 3.61 MIL/uL — ABNORMAL LOW (ref 3.87–5.11)
RDW: 19.5 % — ABNORMAL HIGH (ref 11.5–15.5)
WBC: 4.2 10*3/uL (ref 4.0–10.5)
nRBC: 0 % (ref 0.0–0.2)

## 2022-01-26 MED ORDER — ENSURE ENLIVE PO LIQD: 237.0000 mL | Freq: Two times a day (BID) | ORAL | 12 refills | Status: DC

## 2022-01-26 MED ORDER — PANTOPRAZOLE SODIUM 40 MG PO TBEC: 40.0000 mg | DELAYED_RELEASE_TABLET | Freq: Two times a day (BID) | ORAL | 0 refills | Status: AC

## 2022-01-26 MED ORDER — ASPIRIN 81 MG PO TABS: 81.0000 mg | ORAL_TABLET | Freq: Every day | ORAL | 0 refills | Status: DC

## 2022-01-26 MED ORDER — ACETAMINOPHEN 325 MG PO TABS
650.0000 mg | ORAL_TABLET | Freq: Four times a day (QID) | ORAL | 0 refills | Status: AC | PRN
Start: 2022-01-26 — End: 2022-02-25

## 2022-01-26 MED ORDER — FOLIC ACID 1 MG PO TABS
1.0000 mg | ORAL_TABLET | Freq: Every day | ORAL | 0 refills | Status: AC
Start: 2022-01-26 — End: 2022-01-31

## 2022-01-26 MED ORDER — NICOTINE 14 MG/24HR TD PT24: 14.0000 mg | MEDICATED_PATCH | Freq: Every day | TRANSDERMAL | 0 refills | Status: DC

## 2022-01-26 MED ORDER — DIAZEPAM 2 MG PO TABS: 2.0000 mg | ORAL_TABLET | Freq: Four times a day (QID) | ORAL | 0 refills | Status: AC | PRN

## 2022-01-26 NOTE — TOC Transition Note (Signed)
Transition of Care [facility]) - CM/SW Discharge Note   Patient Details  Name: Sarah Rich MRN: [id number] Date of Birth: 1945/11/14  [facility] CM/SW Contact:  Ihor Gully, LCSW Phone Number: 01/26/2022, 11:15 AM   Clinical Narrative:    D/c clinicals sent to facility. TOC signing off.    Final next level of care: Skilled Nursing Facility Barriers to Discharge: No Barriers Identified   Patient Goals and CMS Choice        Discharge Placement              Patient chooses bed at: [facility] Patient to be transferred to facility by: [facility] Name of family member notified: Son Patient and family notified of of transfer: 01/26/22  Discharge Plan and Services     Post Acute Care Choice: [place]                               Social Determinants of Health (SDOH) Interventions     Readmission Risk Interventions     View : No data to display.
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
## 2022-01-26 NOTE — Care Management Important Message (Signed)
Important Message  Patient Details  Name: Sarah Rich MRN: [id number] Date of Birth: 1946-01-24   Medicare Important Message Given:  Yes     Tommy Medal 01/26/2022, 10:11 AM

## 2022-01-26 NOTE — Progress Notes (Signed)
Patient discharged to [facility], transported by EMS to facility. Discharge paperwork given to EMS to give to facility.  Belongings sent with patient to facility. Attempted to call report to facility x 3, transferred then no answer. Will continue to call report to facility.

## 2022-01-26 NOTE — Hospital Course (Signed)
Sarah Rich Is a 76 y.o. 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 2 clips applied to control bleeding.

## 2022-02-28 ENCOUNTER — Ambulatory Visit: Payer: Medicare Other | Admitting: Gastroenterology

## 2022-03-01 ENCOUNTER — Telehealth: Payer: Self-pay | Admitting: Pulmonary Disease

## 2022-03-01 DIAGNOSIS — E43 Unspecified severe protein-calorie malnutrition: Secondary | ICD-10-CM

## 2022-03-01 DIAGNOSIS — R627 Adult failure to thrive: Secondary | ICD-10-CM

## 2022-03-01 DIAGNOSIS — J432 Centrilobular emphysema: Secondary | ICD-10-CM

## 2022-03-02 NOTE — Telephone Encounter (Signed)
Called and spoke with Nira Conn. She verbalized understanding. She is aware that the orders will be placed today.   She had called a friend who works for [facility] to let them know to be on the lookout for the referral. [facility] has already asked if Dr. Elsworth Soho would be the attending provider or if he wanted one of the [facility] doctors to be the attending.   Dr. Elsworth Soho, can you please advise? Thanks!

## 2022-03-02 NOTE — Telephone Encounter (Signed)
Called and spoke with Sarah Rich. She stated that since her last visit, she has continued to go downhill. She went to the hospital about a week after the last visit due to a GI bleed. She was then discharged to a rehab facility. She is refusing to participate in rehab as well as eat and drink. On average she will eat 2-3 bites of food per day and sip 2-3 times a day. She has no energy.   The patient has expressed interest in coming home. Sarah Rich stated that she will definitely need help if she is released home because she is total care now.   Sarah Rich wanted to know if Dr. Elsworth Soho would be willing to refer her to [facility]osp[facility]. She does not think she has much time left at the rate she is going. She wants her to be as comfortable as possible.   Dr. Elsworth Soho, can you please advise? Thanks!

## 2022-03-18 ENCOUNTER — Telehealth: Payer: Self-pay | Admitting: Pulmonary Disease

## 2022-03-18 NOTE — Telephone Encounter (Signed)
Anderson Malta from [facility] called and would like to know if Dr. Elsworth Soho would be the primary provider/ attending provider. Anderson Malta states message was sent on 03/02/2022, but no response.   Please advise- Jennifer's direct call back number is [phone]. She will be in until 4pm- but you can leave a voicemail.

## 2022-03-18 NOTE — Telephone Encounter (Signed)
Anderson Malta from [facility] is calling to determine if Dr Elsworth Soho is going to be the primary doctor for this patient.   Please advise Dr Elsworth Soho

## 2022-03-21 NOTE — Telephone Encounter (Signed)
Called and left message for patient that hospice should contact the patients PCP directly per the request of Dr Elsworth Soho in regards to this matter. Nothing further needed

## 2022-04-18 ENCOUNTER — Other Ambulatory Visit: Payer: Self-pay

## 2022-04-18 ENCOUNTER — Emergency Department (HOSPITAL_COMMUNITY): Payer: Medicare Other

## 2022-04-18 ENCOUNTER — Emergency Department (HOSPITAL_COMMUNITY)
Admission: EM | Admit: 2022-04-18 | Discharge: 2022-04-18 | Disposition: A | Discharge status: Home / Self Care | Payer: Medicare Other | Attending: Emergency Medicine | Admitting: Emergency Medicine

## 2022-04-18 ENCOUNTER — Encounter (HOSPITAL_COMMUNITY): Payer: Self-pay

## 2022-04-18 DIAGNOSIS — W06XXXA Fall from bed, initial encounter: Secondary | ICD-10-CM | POA: Diagnosis not present

## 2022-04-18 DIAGNOSIS — J441 Chronic obstructive pulmonary disease with (acute) exacerbation: Secondary | ICD-10-CM | POA: Diagnosis not present

## 2022-04-18 DIAGNOSIS — Z7982 Long term (current) use of aspirin: Secondary | ICD-10-CM | POA: Insufficient documentation

## 2022-04-18 DIAGNOSIS — S20302A Unspecified superficial injuries of left front wall of thorax, initial encounter: Secondary | ICD-10-CM | POA: Diagnosis present

## 2022-04-18 DIAGNOSIS — F0390 Unspecified dementia without behavioral disturbance: Secondary | ICD-10-CM | POA: Diagnosis not present

## 2022-04-18 DIAGNOSIS — S20212A Contusion of left front wall of thorax, initial encounter: Secondary | ICD-10-CM

## 2022-04-18 DIAGNOSIS — S2232XA Fracture of one rib, left side, initial encounter for closed fracture: Secondary | ICD-10-CM

## 2022-04-18 MED ORDER — DEXAMETHASONE SODIUM PHOSPHATE 10 MG/ML IJ SOLN
10.0000 mg | Freq: Once | INTRAMUSCULAR | Status: AC
  Administered 2022-04-18: 10 mg via INTRAMUSCULAR
  Filled 2022-04-18: qty 1

## 2022-04-18 MED ORDER — ALBUTEROL SULFATE HFA 108 (90 BASE) MCG/ACT IN AERS
2.0000 | INHALATION_SPRAY | RESPIRATORY_TRACT | Status: DC | PRN
Start: 2022-04-18 — End: 2022-04-18
  Administered 2022-04-18: 2 via RESPIRATORY_TRACT
  Filled 2022-04-18: qty 6.7

## 2022-04-18 MED ORDER — DOXYCYCLINE HYCLATE 100 MG PO CAPS: 100.0000 mg | ORAL_CAPSULE | Freq: Two times a day (BID) | ORAL | 0 refills | Status: DC

## 2022-04-18 MED ORDER — PREDNISONE 20 MG PO TABS
40.0000 mg | ORAL_TABLET | Freq: Every day | ORAL | 0 refills | Status: DC
Start: 2022-04-18 — End: 2023-03-02

## 2022-04-18 MED ORDER — AEROCHAMBER PLUS FLO-VU MEDIUM MISC
1.0000 | Freq: Once | Status: AC
  Administered 2022-04-18: 1
  Filled 2022-04-18: qty 1

## 2022-04-18 NOTE — ED Triage Notes (Signed)
Pt arrived from home via [facility] w c/o pain while breathing status post fall this am. Pt is noted to have large deep purple bruise to upper left chest.

## 2022-04-18 NOTE — ED Provider Notes (Signed)
[facility] EMERGENCY DEPARTMENT Provider Note   CSN: [id number] Arrival date & time: 04/18/22  0019     History  Chief Complaint  Patient presents with   Lytle Michaels    Sarah Rich is a 76 y.o. female.  Presents to the emergency department for evaluation after a fall.  Patient fell at home earlier today.  She was trying to get out of bed, got her foot caught and fell forward.  Patient hit her chest on the ground and has a large bruise in the upper left chest.  Tonight she has been complaining that hurts to breathe and has been short of breath.       Home Medications Prior to Admission medications   Medication Sig Start Date End Date Taking? Authorizing Provider  doxycycline (VIBRAMYCIN) 100 MG capsule Take 1 capsule (100 mg total) by mouth 2 (two) times daily. 04/18/22  Yes Seaborn Nakama, Gwenyth Allegra, MD  predniSONE (DELTASONE) 20 MG tablet Take 2 tablets (40 mg total) by mouth daily with breakfast. 04/18/22  Yes Kimarie Coor, Gwenyth Allegra, MD  acetylcysteine (MUCOMYST) 20 % nebulizer solution Take 3 mLs by nebulization 4 (four) times daily. Patient taking differently: Take 3 mLs by nebulization 4 (four) times daily as needed (shortness of breath, wheezing). 05/03/18   Kathie Dike, MD  albuterol (PROVENTIL HFA;VENTOLIN HFA) 108 (90 BASE) MCG/ACT inhaler Inhale 2 puffs into the lungs every 6 (six) hours as needed for wheezing or shortness of breath. Shortness of Breath    [provider]  albuterol (PROVENTIL) (2.5 MG/3ML) 0.083% nebulizer solution Take 3 mLs (2.5 mg total) by nebulization 4 (four) times daily. Patient taking differently: Take 2.5 mg by nebulization 4 (four) times daily as needed for wheezing or shortness of breath. 05/03/18   Kathie Dike, MD  aspirin 81 MG tablet Take 1 tablet (81 mg total) by mouth daily. 01/31/22   Shahmehdi, Valeria Batman, MD  atenolol (TENORMIN) 50 MG tablet Take 50 mg by mouth daily.    [provider]  cholecalciferol (VITAMIN D) 1000  UNITS tablet Take 1,000 Units by mouth daily.    [provider]  feeding supplement (ENSURE ENLIVE / ENSURE PLUS) LIQD Take 237 mLs by mouth 2 (two) times daily between meals. 01/26/22   Shahmehdi, Valeria Batman, MD  fluticasone (FLONASE) 50 MCG/ACT nasal spray Place 1-2 sprays into both nostrils daily as needed for allergies. 08/30/17   [provider]  Fluticasone-Umeclidin-Vilant (TRELEGY ELLIPTA) 100-62.5-25 MCG/ACT AEPB INHALE 1 PUFF INTO LUNGS ONCE DAILY Patient taking differently: Inhale 1 puff into the lungs daily. 10/11/21   Rigoberto Noel, MD  furosemide (LASIX) 40 MG tablet Take 40 mg by mouth daily as needed for fluid. 02/18/18   [provider]  nicotine (NICODERM CQ - DOSED IN MG/24 HOURS) 14 mg/24hr patch Place 1 patch (14 mg total) onto the skin daily. 01/26/22   Shahmehdi, Valeria Batman, MD  pantoprazole (PROTONIX) 40 MG tablet Take 1 tablet (40 mg total) by mouth 2 (two) times daily before a meal. 01/26/22 02/25/22  Shahmehdi, Valeria Batman, MD  potassium chloride (KLOR-CON) 20 MEQ packet Take 20 mEq by mouth daily.    [provider]  pravastatin (PRAVACHOL) 40 MG tablet Take 40 mg by mouth daily.    [provider]  vitamin B-12 (CYANOCOBALAMIN) 1000 MCG tablet Take 1,000 mcg by mouth daily.    [provider]      Allergies    Sulfa antibiotics, Codeine, and Levaquin [levofloxacin]  Review of Systems   Review of Systems  Physical Exam Updated Vital Signs BP (!) 171/83   Pulse 63   Temp (!) 97.2 F (36.2 C) (Oral)   Resp 14   SpO2 100%  Physical Exam Vitals and nursing note reviewed.  Constitutional:      General: She is not in acute distress.    Appearance: She is well-developed.  HENT:     Head: Normocephalic and atraumatic.     Mouth/Throat:     Mouth: Mucous membranes are moist.  Eyes:     General: Vision grossly intact. Gaze aligned appropriately.     Extraocular Movements: Extraocular movements intact.      Conjunctiva/sclera: Conjunctivae normal.  Cardiovascular:     Rate and Rhythm: Normal rate and regular rhythm.     Pulses: Normal pulses.     Heart sounds: Normal heart sounds, S1 normal and S2 normal. No murmur heard.    No friction rub. No gallop.  Pulmonary:     Effort: Pulmonary effort is normal. No respiratory distress.     Breath sounds: Normal breath sounds.  Chest:     Chest wall: Tenderness (Ecchymosis) present. No deformity or crepitus.  Abdominal:     General: Bowel sounds are normal.     Palpations: Abdomen is soft.     Tenderness: There is no abdominal tenderness. There is no guarding or rebound.     Hernia: No hernia is present.  Musculoskeletal:        General: No swelling.     Cervical back: Full passive range of motion without pain, normal range of motion and neck supple. No spinous process tenderness or muscular tenderness. Normal range of motion.     Right lower leg: No edema.     Left lower leg: No edema.  Skin:    General: Skin is warm and dry.     Capillary Refill: Capillary refill takes less than 2 seconds.     Findings: Bruising present. No ecchymosis, erythema, rash or wound.  Neurological:     General: No focal deficit present.     Mental Status: She is alert and oriented to person, place, and time.     GCS: GCS eye subscore is 4. GCS verbal subscore is 5. GCS motor subscore is 6.     Cranial Nerves: Cranial nerves 2-12 are intact.     Sensory: Sensation is intact.     Motor: Motor function is intact.     Coordination: Coordination is intact.  Psychiatric:        Attention and Perception: Attention normal.        Mood and Affect: Mood normal.        Speech: Speech normal.        Behavior: Behavior normal.     ED Results / Procedures / Treatments   Labs (all labs ordered are listed, but only abnormal results are displayed) Labs Reviewed - No data to display  EKG None  Radiology CT CERVICAL SPINE WO CONTRAST  Result Date: 04/18/2022 CLINICAL  DATA:  Head and neck trauma EXAM: CT HEAD WITHOUT CONTRAST CT CERVICAL SPINE WITHOUT CONTRAST TECHNIQUE: Multidetector CT imaging of the head and cervical spine was performed following the standard protocol without intravenous contrast. Multiplanar CT image reconstructions of the cervical spine were also generated. RADIATION DOSE REDUCTION: This exam was performed according to the departmental dose-optimization program which includes automated exposure control, adjustment of the mA and/or kV according to patient size and/or use of  iterative reconstruction technique. COMPARISON:  CT head 03/18/2013 FINDINGS: CT HEAD FINDINGS Brain: No intracranial hemorrhage, mass effect, or evidence of acute infarct. No hydrocephalus. No extra-axial fluid collection. Generalized cerebral atrophy. Ill-defined hypoattenuation within the cerebral white matter is nonspecific but consistent with chronic small vessel ischemic disease. Vascular: No hyperdense vessel. Calcification of the intracranial internal carotid arteries. Skull: No fracture or focal lesion. Sinuses/Orbits: No acute finding. Paranasal sinuses and mastoid air cells are well aerated. Other: None. CT CERVICAL SPINE FINDINGS Alignment: Slight anterolisthesis of C4 on C5 is presumed chronic. Skull base and vertebrae: No acute fracture. No primary bone lesion or focal pathologic process. Soft tissues and spinal canal: No prevertebral fluid or swelling. No visible canal hematoma. Disc levels: Multilevel spondylosis, disc space height loss and degenerative endplate changes greatest at C6-T1 where it is advanced. Multilevel advanced cervical facet arthropathy. Posterior disc osteophyte complexes cause at most mild effacement of the ventral thecal sac at C6-C7. Uncovertebral spurring and facet arthropathy cause multilevel neural foraminal narrowing greatest bilaterally at C5-C6 and C6-C7 where it is moderate. Upper chest: Emphysema in the upper lobes.  Biapical scarring. Other:  Carotid bulb calcifications. IMPRESSION: 1. No acute intracranial abnormality. Generalized atrophy and small vessel white matter disease. 2. No acute fracture in the cervical spine. Multilevel degenerative spondylosis. 3.  Emphysema (ICD10-J43.9). Electronically Signed   By: Placido Sou M.D.   On: 04/18/2022 01:10   CT HEAD WO CONTRAST (5MM)  Result Date: 04/18/2022 CLINICAL DATA:  Head and neck trauma EXAM: CT HEAD WITHOUT CONTRAST CT CERVICAL SPINE WITHOUT CONTRAST TECHNIQUE: Multidetector CT imaging of the head and cervical spine was performed following the standard protocol without intravenous contrast. Multiplanar CT image reconstructions of the cervical spine were also generated. RADIATION DOSE REDUCTION: This exam was performed according to the departmental dose-optimization program which includes automated exposure control, adjustment of the mA and/or kV according to patient size and/or use of iterative reconstruction technique. COMPARISON:  CT head 03/18/2013 FINDINGS: CT HEAD FINDINGS Brain: No intracranial hemorrhage, mass effect, or evidence of acute infarct. No hydrocephalus. No extra-axial fluid collection. Generalized cerebral atrophy. Ill-defined hypoattenuation within the cerebral white matter is nonspecific but consistent with chronic small vessel ischemic disease. Vascular: No hyperdense vessel. Calcification of the intracranial internal carotid arteries. Skull: No fracture or focal lesion. Sinuses/Orbits: No acute finding. Paranasal sinuses and mastoid air cells are well aerated. Other: None. CT CERVICAL SPINE FINDINGS Alignment: Slight anterolisthesis of C4 on C5 is presumed chronic. Skull base and vertebrae: No acute fracture. No primary bone lesion or focal pathologic process. Soft tissues and spinal canal: No prevertebral fluid or swelling. No visible canal hematoma. Disc levels: Multilevel spondylosis, disc space height loss and degenerative endplate changes greatest at C6-T1 where  it is advanced. Multilevel advanced cervical facet arthropathy. Posterior disc osteophyte complexes cause at most mild effacement of the ventral thecal sac at C6-C7. Uncovertebral spurring and facet arthropathy cause multilevel neural foraminal narrowing greatest bilaterally at C5-C6 and C6-C7 where it is moderate. Upper chest: Emphysema in the upper lobes.  Biapical scarring. Other: Carotid bulb calcifications. IMPRESSION: 1. No acute intracranial abnormality. Generalized atrophy and small vessel white matter disease. 2. No acute fracture in the cervical spine. Multilevel degenerative spondylosis. 3.  Emphysema (ICD10-J43.9). Electronically Signed   By: Placido Sou M.D.   On: 04/18/2022 01:10   DG Ribs Bilateral W/Chest  Result Date: 04/18/2022 CLINICAL DATA:  Recent fall with difficulty breathing, initial encounter EXAM: BILATERAL RIBS AND CHEST - 4+  VIEW COMPARISON:  01/27/2022 FINDINGS: Cardiac shadow is within normal limits. Aortic calcifications are noted. The lungs are well aerated bilaterally. No definitive pneumothorax is seen. Mildly displaced left fourth rib fracture is noted anteriorly. Nipple shadows are noted bilaterally. No other definitive rib fractures are seen. IMPRESSION: Mildly displaced left fourth rib fracture anteriorly. No complicating factors are noted. Electronically Signed   By: Inez Catalina M.D.   On: 04/18/2022 00:59    Procedures Procedures    Medications Ordered in ED Medications  dexamethasone (DECADRON) injection 10 mg (has no administration in time range)  albuterol (VENTOLIN HFA) 108 (90 Base) MCG/ACT inhaler 2 puff (has no administration in time range)  AeroChamber Plus Flo-Vu Medium MISC 1 each (has no administration in time range)    ED Course/ Medical Decision Making/ A&P                           Medical Decision Making Amount and/or Complexity of Data Reviewed Radiology: ordered.  Risk Prescription drug management.   Patient presents to the  emergency department for evaluation of injury from a fall.  Patient fell from either seated or standing position earlier today, injuring her chest.  Patient with a large bruise on the left upper chest wall.  Patient has severe dementia, is accompanied by her family who provide information.  Patient has been coughing a lot over the last few days.  Chest x-ray does not show pneumonia, pneumothorax, pulmonary contusion.  She does have a minimally displaced left rib fracture that explains her pain.  It was not clear if she had any injury to her head or neck from the fall because of her dementia.  CT head and cervical spine performed, no injury noted.  Raquel Sarna reluctant to give any strong pain medications.  We will continue Tylenol as needed.  It does appear that she has a history of COPD and is not currently on any breathing treatments.  Give a shot of Decadron, albuterol inhaler, empiric doxycycline.  Follow-up with PCP.  Return for pneumonia symptoms.        Final Clinical Impression(s) / ED Diagnoses Final diagnoses:  Chest wall contusion, left, initial encounter  Chronic obstructive pulmonary disease with acute exacerbation ([place])    Rx / DC Orders ED Discharge Orders          Ordered    predniSONE (DELTASONE) 20 MG tablet  Daily with breakfast        04/18/22 0128    doxycycline (VIBRAMYCIN) 100 MG capsule  2 times daily        04/18/22 0128              Orpah Greek, MD 04/18/22 0128

## 2022-04-18 NOTE — ED Notes (Signed)
Notified [facility] of patient needing transportation back home.

## 2022-04-26 ENCOUNTER — Ambulatory Visit (INDEPENDENT_AMBULATORY_CARE_PROVIDER_SITE_OTHER): Admitting: Pulmonary Disease

## 2022-04-26 ENCOUNTER — Encounter: Payer: Self-pay | Admitting: Pulmonary Disease

## 2022-04-26 DIAGNOSIS — J9611 Chronic respiratory failure with hypoxia: Secondary | ICD-10-CM

## 2022-04-26 DIAGNOSIS — J432 Centrilobular emphysema: Secondary | ICD-10-CM

## 2022-04-26 DIAGNOSIS — R627 Adult failure to thrive: Secondary | ICD-10-CM | POA: Diagnosis not present

## 2022-04-26 NOTE — Assessment & Plan Note (Signed)
She has rapidly declined since her last office visit with me and is now on hospice care.  She also has 24-hour home care and son would not like to place her in a nursing home.  She may need residential hospice soon noting her trajectory. We discussed caregiver fatigue and other issues involved in taking care of her terminally ill patient for the family

## 2022-04-26 NOTE — Assessment & Plan Note (Signed)
Continue oxygen at all times

## 2022-04-26 NOTE — Progress Notes (Signed)
   Subjective:    Patient ID: Sarah Rich, female    DOB: 1946-02-22, 76 y.o.   MRN: [id number]  HPI  76 yo smoker for FU of  severe COPD and hypoxic respiratory failure She had an enlarging left lower lobe hypermetabolic nodule, adjacent to pericardium and underwent SBRT 05/2019, felt to be too high risk to biopsy and not a candidate for VATS   PMH - dementia    She is on oxygen since her hospitalization in 2019, uses 3 L at home and 2 L pulse on her POC. She continues to smoke 3 to 4 cigarettes/day.   Started on trelegy 01/2020 -  Chief Complaint  Patient presents with   Follow-up    Has had hosp admissions since last ov. Patient is now home with hospice assistance. 24 hour care. Breathing is about the same 3LO2 24/7    Since her last visit 01/2022, she had hospitalizations for GI bleed required EGD and was subsequently transition to hospice care 02/2022. She had is in a wheelchair today accompanied by her son and daughter-in-law who provide additional history She developed agitation and was started on Haldol and Ativan was increased to twice daily.  She had breakthrough agitation and hydroxyzine has been added.  She had an ED visit on 8/14 which I reviewed after a fall and a left chest contusion and a fractured rib noted on chest x-ray. Head CT and CT of cervical spine was normal   Significant tests/ events reviewed  11/2020 On ambulation heart rate increased from 60-80 and oxygen saturation stayed at 98% on 3 L pulse.     CT chest 08/2021 LLL scar, RML GG nodule stable size but increased density , RLL nodule -resolved     CT chest 09/2019 >> Within the LEFT lower lobe, nodule abutting the pericardial surface is again demonstrated measuring 16 mm x 15 mm compared to 16 mm x 16 mm on CT 01/13/2019 and from 19 mm x 13 mm on most recent exam from 06/26/2019.   -Ground-glass nodule in the RIGHT middle lobe 1.5 by 1.4 cm is unchanged   PFTs 03/2019 severe airway obstruction, ratio  30, FEV1 0.54/20%, FVC 53%, DLCO 35% ABG 10/2015 7.3 2/54/322 100%   Review of Systems neg for any significant sore throat, dysphagia, itching, sneezing, nasal congestion or excess/ purulent secretions, fever, chills, sweats, unintended wt loss, pleuritic or exertional cp, hempoptysis, orthopnea pnd or change in chronic leg swelling. Also denies presyncope, palpitations, heartburn, abdominal pain, nausea, vomiting, diarrhea or change in bowel or urinary habits, dysuria,hematuria, rash, arthralgias, visual complaints, headache, numbness weakness or ataxia.     Objective:   Physical Exam  Gen. Pleasant, thin frail woman in wheelchair, in no distress ENT - no thrush, no pallor/icterus,no post nasal drip Neck: No JVD, no thyromegaly, no carotid bruits Lungs: no use of accessory muscles, no dullness to percussion, clear without rales or rhonchi  Cardiovascular: Rhythm regular, heart sounds  normal, no murmurs or gallops, no peripheral edema Musculoskeletal: No deformities, no cyanosis or clubbing  Neuro -pleasantly confused Skin -multiple bruises       Assessment & Plan:

## 2022-04-26 NOTE — Assessment & Plan Note (Signed)
She is on Trelegy. Okay to transition to oral prednisone and nebs for hospice care if needed

## 2022-07-06 DEATH — deceased

## 2022-12-27 IMAGING — MG DIGITAL DIAGNOSTIC BILAT W/ TOMO W/ CAD
6 of 12 series · 6 of 36 positions shown · non-contrast
Comparison: Previous exam(s).

CLINICAL DATA: Patient has diffuse LEFT breast pain. Patient does
not feel a mass. History of small cell carcinoma of the lung,
treated with radiation. Remote history of benign surgery in the
periareolar region of the RIGHT breast.

EXAM:
DIGITAL DIAGNOSTIC BILATERAL MAMMOGRAM WITH CAD AND TOMO
ULTRASOUND BILATERAL BREAST

[L MLO synth-2D]
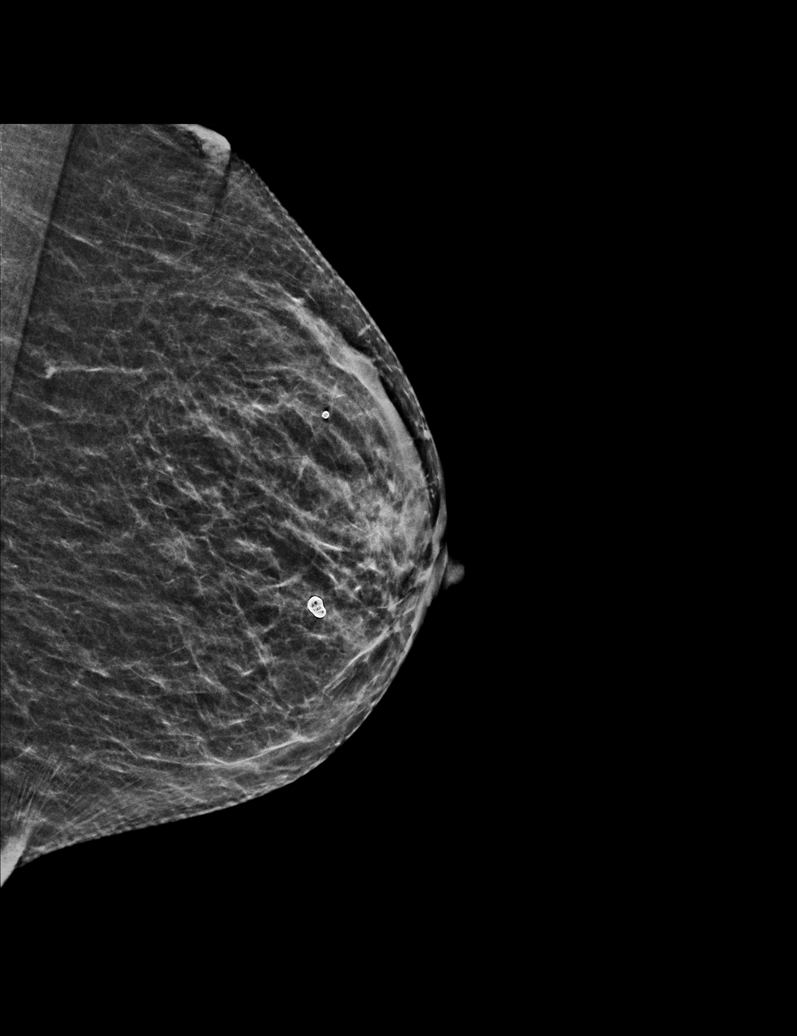

[R CC synth-2D (1 of 2)]
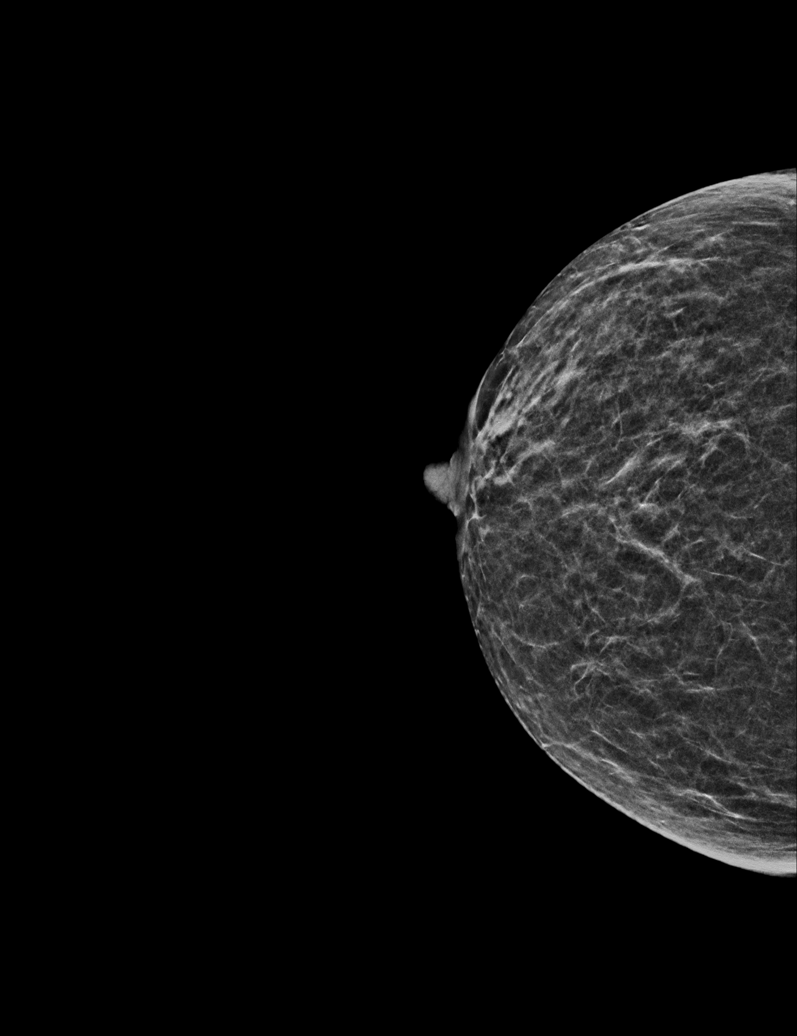

[R MLO synth-2D (1 of 2)]
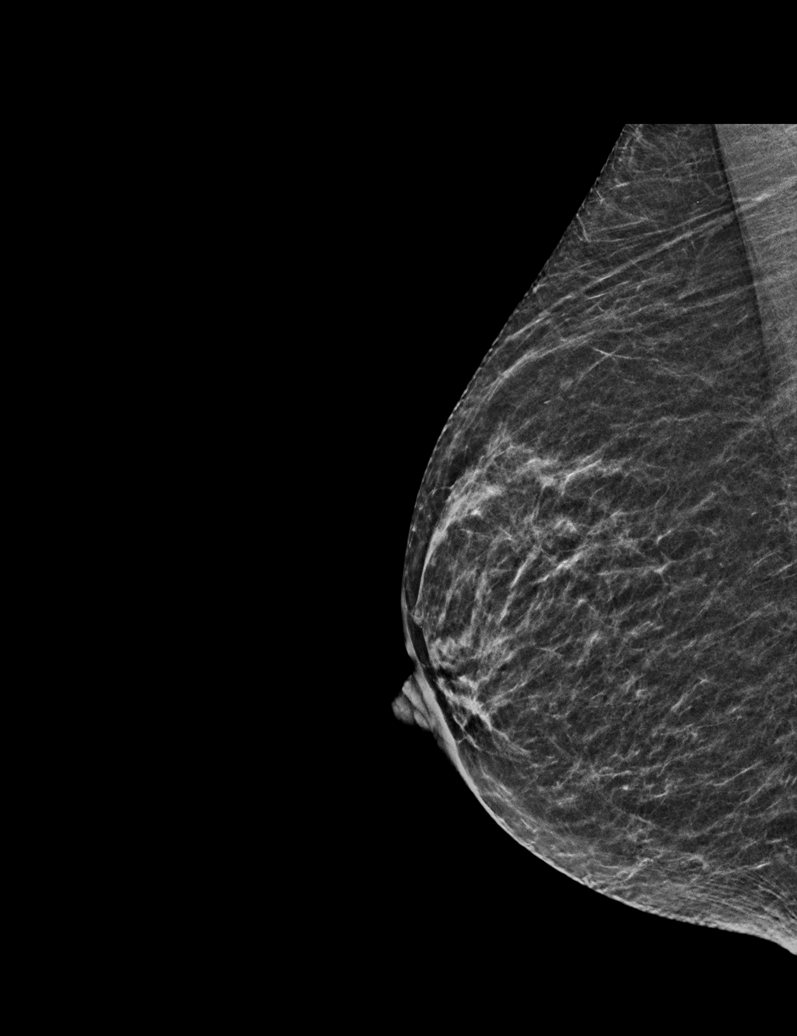

[L CC synth-2D]
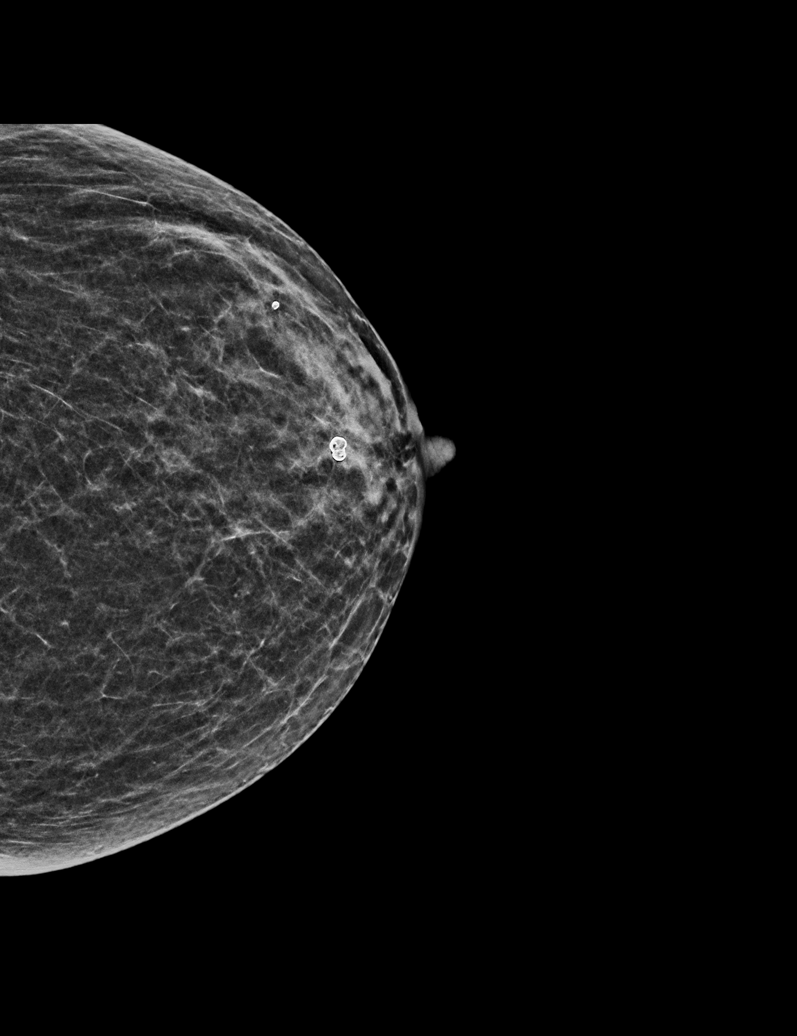

[R CC synth-2D (2 of 2)]
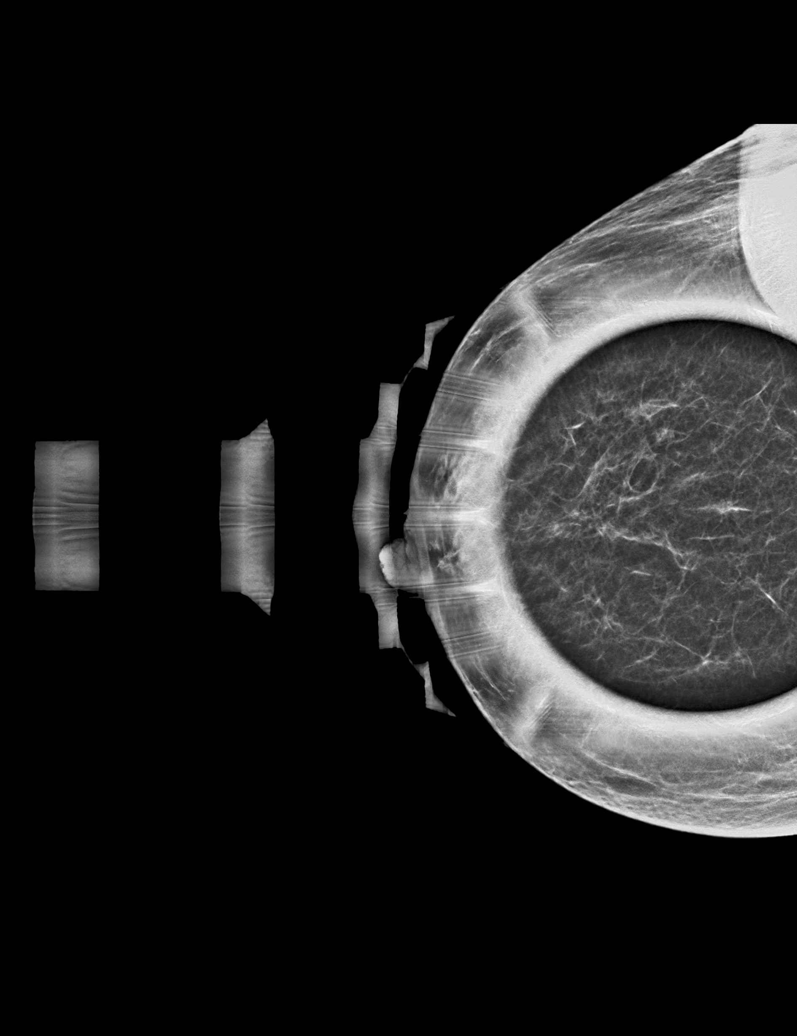

[R MLO synth-2D (2 of 2)]
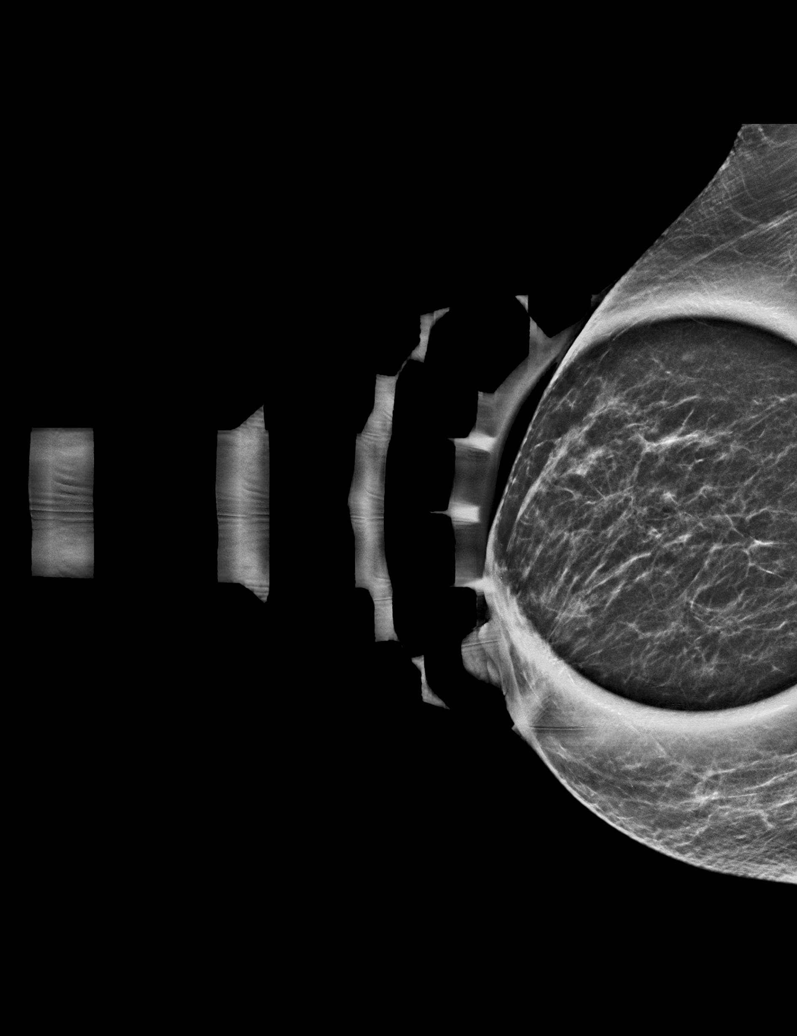

[6 of 36 positions shown; findings below may reference images not displayed]

ACR Breast Density Category b: There are scattered areas of
fibroglandular density.
FINDINGS: RIGHT BREAST:

Mammogram: Surgical distortion in the UPPER central portion of the
RIGHT breast is confirmed on spot compression views and related to
prior periareolar benign biopsy. No suspicious mass, distortion, or
microcalcifications are identified to suggest presence of
malignancy. Mammographic images were processed with CAD.

Physical Exam: Well-healed incision along the superior periareolar
region of the RIGHT breast.

LEFT BREAST:

Mammogram: No suspicious mass, distortion, or microcalcifications
are identified to suggest presence of malignancy. Mammographic
images were processed with CAD.
IMPRESSION: No mammographic evidence for malignancy.

RECOMMENDATION:
Screening mammogram in one year.(Code:[id number])

I have discussed the findings and recommendations with the patient.
If applicable, a reminder letter will be sent to the patient
regarding the next appointment.

BI-RADS CATEGORY  1: Negative.

## 2023-02-02 ENCOUNTER — Telehealth: Payer: Self-pay | Admitting: Pulmonary Disease

## 2023-02-02 NOTE — Telephone Encounter (Signed)
Jill with [facility] called to request the doctor's signature for POC and verbal orders.  She stated that the papers were faxed several times but she has not gotten any response.  She would like to close the chart, but needs to get the signatures before she can close it.  Please advise and call to discuss further.  CB# [phone]

## 2023-02-02 NOTE — Telephone Encounter (Signed)
Called and spoke with Sarah Rich. Sarah Rich stated that she was going to fax it over again and put it to my attention. Advised Sarah Rich I will have dr. Vassie Loll sign the papers that need to be signed for her to close the patients chart. Leaving encounter open until I get the fax taken care of.

## 2023-02-03 NOTE — Telephone Encounter (Signed)
Called and spoke with [place]. Noreene Larsson stated she received everything she needed.   Nothing further needed.
